# Patient Record
Sex: Female | Born: 1970 | Race: Black or African American | Hispanic: No | Marital: Married | Smoking: Never smoker
Health system: Southern US, Community
[De-identification: ages and names within clinical notes are randomized; demographics above are authoritative.]

## PROBLEM LIST (undated history)

## (undated) DIAGNOSIS — I10 Essential (primary) hypertension: Secondary | ICD-10-CM

## (undated) HISTORY — PX: OVARIAN CYST REMOVAL: SHX89

## (undated) HISTORY — PX: TUBAL LIGATION: SHX77

---

## 2013-06-13 ENCOUNTER — Ambulatory Visit: Payer: Self-pay | Admitting: Family Medicine

## 2013-06-13 ENCOUNTER — Encounter (HOSPITAL_COMMUNITY): Payer: Self-pay | Admitting: Emergency Medicine

## 2013-06-13 ENCOUNTER — Emergency Department (INDEPENDENT_AMBULATORY_CARE_PROVIDER_SITE_OTHER): Payer: Medicaid Other

## 2013-06-13 ENCOUNTER — Emergency Department (HOSPITAL_COMMUNITY)
Admission: EM | Admit: 2013-06-13 | Discharge: 2013-06-13 | Disposition: A | Discharge status: Home / Self Care | Payer: Medicaid Other | Source: Home / Self Care | Attending: Emergency Medicine | Admitting: Emergency Medicine

## 2013-06-13 DIAGNOSIS — J209 Acute bronchitis, unspecified: Secondary | ICD-10-CM

## 2013-06-13 MED ORDER — METHYLPREDNISOLONE SODIUM SUCC 125 MG IJ SOLR
INTRAMUSCULAR | Status: AC
  Filled 2013-06-13: qty 2

## 2013-06-13 MED ORDER — IPRATROPIUM BROMIDE 0.02 % IN SOLN
0.5000 mg | Freq: Once | RESPIRATORY_TRACT | Status: AC
  Administered 2013-06-13: 0.5 mg via RESPIRATORY_TRACT

## 2013-06-13 MED ORDER — HYDROCOD POLST-CHLORPHEN POLST 10-8 MG/5ML PO LQCR: 5.0000 mL | Freq: Two times a day (BID) | ORAL | Status: DC | PRN

## 2013-06-13 MED ORDER — IPRATROPIUM BROMIDE 0.02 % IN SOLN
RESPIRATORY_TRACT | Status: AC
  Filled 2013-06-13: qty 2.5

## 2013-06-13 MED ORDER — PREDNISONE 20 MG PO TABS: 20.0000 mg | ORAL_TABLET | Freq: Two times a day (BID) | ORAL | Status: DC

## 2013-06-13 MED ORDER — ALBUTEROL SULFATE HFA 108 (90 BASE) MCG/ACT IN AERS: 1.0000 | INHALATION_SPRAY | Freq: Four times a day (QID) | RESPIRATORY_TRACT | Status: AC | PRN

## 2013-06-13 MED ORDER — SODIUM CHLORIDE 0.9 % IJ SOLN
INTRAMUSCULAR | Status: AC
  Filled 2013-06-13: qty 3

## 2013-06-13 MED ORDER — ALBUTEROL SULFATE (5 MG/ML) 0.5% IN NEBU
5.0000 mg | INHALATION_SOLUTION | Freq: Once | RESPIRATORY_TRACT | Status: AC
  Administered 2013-06-13: 5 mg via RESPIRATORY_TRACT

## 2013-06-13 MED ORDER — AZITHROMYCIN 250 MG PO TABS: ORAL_TABLET | ORAL | Status: DC

## 2013-06-13 MED ORDER — METHYLPREDNISOLONE SODIUM SUCC 125 MG IJ SOLR
125.0000 mg | Freq: Once | INTRAMUSCULAR | Status: AC
  Administered 2013-06-13: 125 mg via INTRAMUSCULAR

## 2013-06-13 MED ORDER — ALBUTEROL SULFATE (5 MG/ML) 0.5% IN NEBU
INHALATION_SOLUTION | RESPIRATORY_TRACT | Status: AC
  Filled 2013-06-13: qty 1

## 2013-06-13 NOTE — ED Notes (Signed)
C/o cough and sob for two days  States chest feel tight Admits to having chills and sore throat

## 2013-06-13 NOTE — ED Provider Notes (Signed)
Chief Complaint:   Chief Complaint  Patient presents with  . Cough  . Shortness of Breath    History of Present Illness:   Lauren Galvan is a 42 year old female who has had a two-day history of cough productive yellow sputum, chest tightness, shortness of breath, aching in her ribs, chills, and sore throat. She denies any wheezing, fever, nasal congestion, rhinorrhea, or GI symptoms. She has no history of asthma. No known exposure to pertussis.  Review of Systems:  Other than noted above, the patient denies any of the following symptoms: Systemic:  No fevers, chills, sweats, weight loss or gain, fatigue, or tiredness. Eye:  No redness or discharge. ENT:  No ear pain, drainage, headache, nasal congestion, drainage, sinus pressure, difficulty swallowing, or sore throat. Neck:  No neck pain or swollen glands. Lungs:  No cough, sputum production, hemoptysis, wheezing, chest tightness, shortness of breath or chest pain. GI:  No abdominal pain, nausea, vomiting or diarrhea.  PMFSH:  Past medical history, family history, social history, meds, and allergies were reviewed. She is allergic to penicillin and latex. She takes hydrochlorothiazide for high blood pressure.  Physical Exam:   Vital signs:  BP 147/80  Pulse 94  Temp(Src) 98.5 F (36.9 C) (Oral)  Resp 16  SpO2 99%  LMP 05/29/2013 General:  Alert and oriented.  In no distress.  Skin warm and dry. Initially appeared somewhat tachypneic, but no audible wheezes, retractions, or use of accessory musculature. She has frequent paroxysms of coughing, but no inspiratory whoop. Eye:  No conjunctival injection or drainage. Lids were normal. ENT:  TMs and canals were normal, without erythema or inflammation.  Nasal mucosa was clear and uncongested, without drainage.  Mucous membranes were moist.  Pharynx was clear with no exudate or drainage.  There were no oral ulcerations or lesions. Neck:  Supple, no adenopathy, tenderness or mass. Lungs:  No  respiratory distress.  Lungs were clear to auscultation, without wheezes, rales or rhonchi.  Breath sounds were clear and equal bilaterally.  Heart:  Regular rhythm, without gallops, murmers or rubs. Skin:  Clear, warm, and dry, without rash or lesions.  Radiology:  Dg Chest 2 View  06/13/2013   CLINICAL DATA:  Cough, shortness of Breath  EXAM: CHEST  2 VIEW  COMPARISON:  None.  FINDINGS: The heart size and mediastinal contours are within normal limits. Both lungs are clear. The visualized skeletal structures are unremarkable.  IMPRESSION: No active cardiopulmonary disease.   Electronically Signed   By: Natasha Mead M.D.   On: 06/13/2013 13:30    Course in Urgent Care Center:   Her lungs were completely clear prior to breathing treatment. She was given Solu-Medrol 125 mg IM and a DuoNeb breathing treatment. After that she felt better, her lungs were still completely clear. She felt like she could go home.  Assessment:  The encounter diagnosis was Acute bronchitis.  Plan:   1.  Meds:  The following meds were prescribed:   Discharge Medication List as of 06/13/2013  1:57 PM    START taking these medications   Details  albuterol (PROVENTIL HFA;VENTOLIN HFA) 108 (90 BASE) MCG/ACT inhaler Inhale 1-2 puffs into the lungs every 6 (six) hours as needed for wheezing or shortness of breath., Starting 06/13/2013, Until Discontinued, Normal    azithromycin (ZITHROMAX Z-PAK) 250 MG tablet Take as directed., Normal    chlorpheniramine-HYDROcodone (TUSSIONEX) 10-8 MG/5ML LQCR Take 5 mLs by mouth every 12 (twelve) hours as needed for cough., Starting 06/13/2013, Until  Discontinued, Normal    predniSONE (DELTASONE) 20 MG tablet Take 1 tablet (20 mg total) by mouth 2 (two) times daily., Starting 06/13/2013, Until Discontinued, Normal        2.  Patient Education/Counseling:  The patient was given appropriate handouts, self care instructions, and instructed in symptomatic relief.   3.  Follow up:  The patient  was told to follow up if no better in 3 to 4 days, if becoming worse in any way, and given some red flag symptoms such as worsening difficulty breathing which would prompt immediate return.  Follow up here if necessary.      Reuben Likes, MD 06/13/13 [phone]

## 2013-06-17 NOTE — ED Notes (Signed)
Spouse has called , cannot afford non-insurance covered Rx, and is using delsym. New note to RTW on Monday

## 2013-07-23 ENCOUNTER — Other Ambulatory Visit (HOSPITAL_COMMUNITY)
Admission: RE | Admit: 2013-07-23 | Discharge: 2013-07-23 | Disposition: A | Discharge status: Home / Self Care | Payer: Medicaid Other | Source: Ambulatory Visit | Attending: Family Medicine | Admitting: Family Medicine

## 2013-07-23 ENCOUNTER — Encounter (HOSPITAL_COMMUNITY): Payer: Self-pay | Admitting: Emergency Medicine

## 2013-07-23 ENCOUNTER — Emergency Department (HOSPITAL_COMMUNITY)
Admission: EM | Admit: 2013-07-23 | Discharge: 2013-07-23 | Disposition: A | Discharge status: Home / Self Care | Payer: Medicaid Other | Source: Home / Self Care | Attending: Family Medicine | Admitting: Family Medicine

## 2013-07-23 DIAGNOSIS — Z113 Encounter for screening for infections with a predominantly sexual mode of transmission: Secondary | ICD-10-CM | POA: Insufficient documentation

## 2013-07-23 DIAGNOSIS — N760 Acute vaginitis: Secondary | ICD-10-CM | POA: Insufficient documentation

## 2013-07-23 HISTORY — DX: Essential (primary) hypertension: I10

## 2013-07-23 LAB — POCT URINALYSIS DIP (DEVICE)
Bilirubin Urine: NEGATIVE
Glucose, UA: NEGATIVE mg/dL
HGB URINE DIPSTICK: NEGATIVE
Ketones, ur: NEGATIVE mg/dL
Leukocytes, UA: NEGATIVE
Nitrite: NEGATIVE
Protein, ur: NEGATIVE mg/dL
Specific Gravity, Urine: 1.025 (ref 1.005–1.030)
Urobilinogen, UA: 0.2 mg/dL (ref 0.0–1.0)
pH: 6 (ref 5.0–8.0)

## 2013-07-23 LAB — POCT PREGNANCY, URINE: Preg Test, Ur: NEGATIVE

## 2013-07-23 MED ORDER — FLUCONAZOLE 150 MG PO TABS: 150.0000 mg | ORAL_TABLET | Freq: Once | ORAL | Status: DC

## 2013-07-23 MED ORDER — METRONIDAZOLE 500 MG PO TABS: 500.0000 mg | ORAL_TABLET | Freq: Two times a day (BID) | ORAL | Status: DC

## 2013-07-23 NOTE — Discharge Instructions (Signed)
Thank you for coming in today. Take metronidazole twice daily for one week. Do not drink alcoholic taking this medication. Take fluconazole once for yeast infection. We will call you if your results are very abnormal. Come back here or followup with your primary care provider if not getting better.  Bacterial Vaginosis Bacterial vaginosis is an infection of the vagina. It happens when too many of certain germs (bacteria) grow in the vagina. HOME CARE  Take your medicine as told by your doctor.  Finish your medicine even if you start to feel better.  Do not have sex until you finish your medicine and are better.  Tell your sex partner that you have an infection. They should see their doctor for treatment.  Practice safe sex. Use condoms. Have only one sex partner. GET HELP IF:  You are not getting better after 3 days of treatment.  You have more grey fluid (discharge) coming from your vagina than before.  You have more pain than before.  You have a fever. MAKE SURE YOU:   Understand these instructions.  Will watch your condition.  Will get help right away if you are not doing well or get worse. Document Released: 04/08/2008 Document Revised: 04/20/2013 Document Reviewed: 02/09/2013 [facility] Patient Information 2014 [employer], [place].

## 2013-07-23 NOTE — ED Notes (Signed)
Pt c/o white vag d/c onset 3 days w/sxs that include: vag itching, dysuria, abd pain Denies: hematuria, f/v/n/d She is alert w/no signs of acute distress.

## 2013-07-23 NOTE — ED Provider Notes (Signed)
Lauren Galvan is a 43 y.o. female who presents to Urgent Care today for to 3 days of vaginal discharge itching and burning sensation. Her symptoms are consistent with yeast infection. No fevers chills nausea vomiting or diarrhea. No urinary frequency urgency or dysuria. No abdominal pain. Patient feels well otherwise.   Past Medical History  Diagnosis Date  . Hypertension    History  Substance Use Topics  . Smoking status: Never Smoker   . Smokeless tobacco: Not on file  . Alcohol Use: No   ROS as above Medications reviewed. No current facility-administered medications for this encounter.   Current Outpatient Prescriptions  Medication Sig Dispense Refill  . albuterol (PROVENTIL HFA;VENTOLIN HFA) 108 (90 BASE) MCG/ACT inhaler Inhale 1-2 puffs into the lungs every 6 (six) hours as needed for wheezing or shortness of breath.  1 Inhaler  0  . fluconazole (DIFLUCAN) 150 MG tablet Take 1 tablet (150 mg total) by mouth once.  1 tablet  1  . metroNIDAZOLE (FLAGYL) 500 MG tablet Take 1 tablet (500 mg total) by mouth 2 (two) times daily.  14 tablet  0    Exam:  BP 120/83  Pulse 98  Temp(Src) 97.9 F (36.6 C) (Oral)  Resp 18  SpO2 98%  LMP 07/18/2013 Gen: Well NAD Abd: NABS, Soft. NT, ND Exts: , warm and well perfused.  GYN: Normal external genitalia. Vaginal canal with thick white discharge. Normal-appearing cervix. Nontender.  Results for orders placed during the hospital encounter of 07/23/13 (from the past 24 hour(s))  POCT URINALYSIS DIP (DEVICE)     Status: None   Collection Time    07/23/13 11:33 AM      Result Value Range   Glucose, UA NEGATIVE  NEGATIVE mg/dL   Bilirubin Urine NEGATIVE  NEGATIVE   Ketones, ur NEGATIVE  NEGATIVE mg/dL   Specific Gravity, Urine 1.025  1.005 - 1.030   Hgb urine dipstick NEGATIVE  NEGATIVE   pH 6.0  5.0 - 8.0   Protein, ur NEGATIVE  NEGATIVE mg/dL   Urobilinogen, UA 0.2  0.0 - 1.0 mg/dL   Nitrite NEGATIVE  NEGATIVE   Leukocytes, UA  NEGATIVE  NEGATIVE  POCT PREGNANCY, URINE     Status: None   Collection Time    07/23/13 11:50 AM      Result Value Range   Preg Test, Ur NEGATIVE  NEGATIVE   No results found.  Assessment and Plan: 43 y.o. female with vaginitis. Plan to treat empirically for both yeast and BV with metronidazole and fluconazole. Cytology pending. Will call patient with results of significantly abnormal.  Discussed warning signs or symptoms. Please see discharge instructions. Patient expresses understanding.    Rodolph BongEvan S Corey, MD 07/23/13 [phone]

## 2013-07-27 NOTE — ED Notes (Signed)
GC/Chlamydia neg., Affirm: Candida pos., Gardnerella and Trich neg.  Pt. adequately treated with Diflucan. Vassie MoselleYork, Satomi Buda M 07/27/2013

## 2013-08-17 ENCOUNTER — Encounter (HOSPITAL_COMMUNITY): Payer: Self-pay | Admitting: Emergency Medicine

## 2013-08-17 ENCOUNTER — Emergency Department (HOSPITAL_COMMUNITY)
Admission: EM | Admit: 2013-08-17 | Discharge: 2013-08-17 | Disposition: A | Discharge status: Home / Self Care | Payer: Medicaid Other | Source: Home / Self Care | Attending: Family Medicine | Admitting: Family Medicine

## 2013-08-17 DIAGNOSIS — I10 Essential (primary) hypertension: Secondary | ICD-10-CM

## 2013-08-17 LAB — POCT I-STAT, CHEM 8
BUN: 10 mg/dL (ref 6–23)
CREATININE: 1.3 mg/dL — AB (ref 0.50–1.10)
Calcium, Ion: 1.26 mmol/L — ABNORMAL HIGH (ref 1.12–1.23)
Chloride: 101 mEq/L (ref 96–112)
GLUCOSE: 85 mg/dL (ref 70–99)
HCT: 44 % (ref 36.0–46.0)
HEMOGLOBIN: 15 g/dL (ref 12.0–15.0)
Potassium: 3.7 mEq/L (ref 3.7–5.3)
SODIUM: 138 meq/L (ref 137–147)
TCO2: 28 mmol/L (ref 0–100)

## 2013-08-17 MED ORDER — AMLODIPINE BESYLATE 5 MG PO TABS
5.0000 mg | ORAL_TABLET | Freq: Every day | ORAL | Status: DC
Start: 2013-08-17 — End: 2014-06-15

## 2013-08-17 NOTE — ED Notes (Signed)
Pt needing refills on her HCTZ that she's been out x1 year C/o constant HA... Hs appt w/new PCP in 2 weeks Denies: CP, nauseas, BV, SOB Alert w/no signs of acute distress.

## 2013-08-17 NOTE — ED Provider Notes (Signed)
CSN: [id number]     Arrival date & time 08/17/13  1815 History   First MD Initiated Contact with Patient 08/17/13 1823     Chief Complaint  Patient presents with  . Hypertension   (Consider location/radiation/quality/duration/timing/severity/associated sxs/prior Treatment) Patient is a 43 y.o. female presenting with hypertension. The history is provided by the patient.  Hypertension This is a chronic problem. The current episode started more than 1 week ago (out of bp med for 1 yr since moved to [facility] The problem occurs constantly. Associated symptoms include headaches. Pertinent negatives include no chest pain, no abdominal pain and no shortness of breath.    Past Medical History  Diagnosis Date  . Hypertension    History reviewed. No pertinent past surgical history. No family history on file. History  Substance Use Topics  . Smoking status: Never Smoker   . Smokeless tobacco: Not on file  . Alcohol Use: No   OB History   Grav Para Term Preterm Abortions TAB SAB Ect Mult Living                 Review of Systems  Constitutional: Positive for fatigue.  Respiratory: Negative for chest tightness and shortness of breath.   Cardiovascular: Negative for chest pain, palpitations and leg swelling.  Gastrointestinal: Negative.  Negative for abdominal pain.  Neurological: Positive for headaches.    Allergies  Latex and Penicillins  Home Medications   Current Outpatient Rx  Name  Route  Sig  Dispense  Refill  . albuterol (PROVENTIL HFA;VENTOLIN HFA) 108 (90 BASE) MCG/ACT inhaler   Inhalation   Inhale 1-2 puffs into the lungs every 6 (six) hours as needed for wheezing or shortness of breath.   1 Inhaler   0   . amLODipine (NORVASC) 5 MG tablet   Oral   Take 1 tablet (5 mg total) by mouth daily.   30 tablet   1   . fluconazole (DIFLUCAN) 150 MG tablet   Oral   Take 1 tablet (150 mg total) by mouth once.   1 tablet   1   . metroNIDAZOLE (FLAGYL) 500 MG tablet    Oral   Take 1 tablet (500 mg total) by mouth 2 (two) times daily.   14 tablet   0    BP 147/93  Pulse 84  Temp(Src) 97.8 F (36.6 C) (Oral)  Resp 18  SpO2 97%  LMP 08/12/2013 Physical Exam  Nursing note and vitals reviewed. Constitutional: She is oriented to person, place, and time. She appears well-developed and well-nourished.  HENT:  Head: Normocephalic.  Right Ear: External ear normal.  Left Ear: External ear normal.  Mouth/Throat: Oropharynx is clear and moist.  Eyes: Conjunctivae are normal. Pupils are equal, round, and reactive to light.  Neck: Normal range of motion. Neck supple. No thyromegaly present.  Cardiovascular: Normal rate, regular rhythm, normal heart sounds and intact distal pulses.   Pulmonary/Chest: Effort normal and breath sounds normal.  Musculoskeletal: She exhibits no edema.  Neurological: She is alert and oriented to person, place, and time.  Skin: Skin is warm and dry.    ED Course  Procedures (including critical care time) Labs Review Labs Reviewed  POCT I-STAT, CHEM 8 - Abnormal; Notable for the following:    Creatinine, Ser 1.30 (*)    Calcium, Ion 1.26 (*)    All other components within normal limits   Imaging Review No results found.    MDM      Linna Hoff,  MD 08/17/13 Ernestina [id number]

## 2013-09-23 ENCOUNTER — Emergency Department (HOSPITAL_COMMUNITY): Payer: Medicaid Other

## 2013-09-23 ENCOUNTER — Encounter (HOSPITAL_COMMUNITY): Payer: Self-pay | Admitting: Emergency Medicine

## 2013-09-23 ENCOUNTER — Emergency Department (INDEPENDENT_AMBULATORY_CARE_PROVIDER_SITE_OTHER): Payer: Medicaid Other

## 2013-09-23 ENCOUNTER — Emergency Department (HOSPITAL_COMMUNITY)
Admission: EM | Admit: 2013-09-23 | Discharge: 2013-09-23 | Disposition: A | Discharge status: Nursing Home (Includes ICF) | Payer: Medicaid Other | Source: Home / Self Care | Attending: Family Medicine | Admitting: Family Medicine

## 2013-09-23 ENCOUNTER — Emergency Department (HOSPITAL_COMMUNITY)
Admission: EM | Admit: 2013-09-23 | Discharge: 2013-09-23 | Disposition: A | Discharge status: Home / Self Care | Payer: Medicaid Other | Attending: Emergency Medicine | Admitting: Emergency Medicine

## 2013-09-23 DIAGNOSIS — Z91040 Latex allergy status: Secondary | ICD-10-CM | POA: Insufficient documentation

## 2013-09-23 DIAGNOSIS — R109 Unspecified abdominal pain: Secondary | ICD-10-CM

## 2013-09-23 DIAGNOSIS — N926 Irregular menstruation, unspecified: Secondary | ICD-10-CM | POA: Insufficient documentation

## 2013-09-23 DIAGNOSIS — E669 Obesity, unspecified: Secondary | ICD-10-CM | POA: Insufficient documentation

## 2013-09-23 DIAGNOSIS — I10 Essential (primary) hypertension: Secondary | ICD-10-CM | POA: Insufficient documentation

## 2013-09-23 DIAGNOSIS — Z79899 Other long term (current) drug therapy: Secondary | ICD-10-CM | POA: Insufficient documentation

## 2013-09-23 DIAGNOSIS — Z880 Allergy status to penicillin: Secondary | ICD-10-CM | POA: Insufficient documentation

## 2013-09-23 DIAGNOSIS — R300 Dysuria: Secondary | ICD-10-CM | POA: Insufficient documentation

## 2013-09-23 LAB — CBC WITH DIFFERENTIAL/PLATELET
BASOS PCT: 1 % (ref 0–1)
Basophils Absolute: 0.1 10*3/uL (ref 0.0–0.1)
EOS ABS: 0.6 10*3/uL (ref 0.0–0.7)
Eosinophils Relative: 6 % — ABNORMAL HIGH (ref 0–5)
HCT: 40.9 % (ref 36.0–46.0)
Hemoglobin: 14.1 g/dL (ref 12.0–15.0)
Lymphocytes Relative: 24 % (ref 12–46)
Lymphs Abs: 2.2 10*3/uL (ref 0.7–4.0)
MCH: 30 pg (ref 26.0–34.0)
MCHC: 34.5 g/dL (ref 30.0–36.0)
MCV: 87 fL (ref 78.0–100.0)
Monocytes Absolute: 1 10*3/uL (ref 0.1–1.0)
Monocytes Relative: 11 % (ref 3–12)
NEUTROS PCT: 58 % (ref 43–77)
Neutro Abs: 5.1 10*3/uL (ref 1.7–7.7)
PLATELETS: 292 10*3/uL (ref 150–400)
RBC: 4.7 MIL/uL (ref 3.87–5.11)
RDW: 13.2 % (ref 11.5–15.5)
WBC: 8.9 10*3/uL (ref 4.0–10.5)

## 2013-09-23 LAB — COMPREHENSIVE METABOLIC PANEL
ALBUMIN: 3.6 g/dL (ref 3.5–5.2)
ALT: 12 U/L (ref 0–35)
AST: 17 U/L (ref 0–37)
Alkaline Phosphatase: 65 U/L (ref 39–117)
BUN: 8 mg/dL (ref 6–23)
CO2: 26 mEq/L (ref 19–32)
Calcium: 9.3 mg/dL (ref 8.4–10.5)
Chloride: 100 mEq/L (ref 96–112)
Creatinine, Ser: 0.78 mg/dL (ref 0.50–1.10)
GFR calc Af Amer: >90 mL/min (ref >90)
GFR calc non Af Amer: >90 mL/min (ref >90)
Glucose, Bld: 93 mg/dL (ref 70–99)
Potassium: 4 mEq/L (ref 3.7–5.3)
SODIUM: 138 meq/L (ref 137–147)
TOTAL PROTEIN: 8.2 g/dL (ref 6.0–8.3)
Total Bilirubin: 0.3 mg/dL (ref 0.3–1.2)

## 2013-09-23 LAB — POCT URINALYSIS DIP (DEVICE)
BILIRUBIN URINE: NEGATIVE
Glucose, UA: NEGATIVE mg/dL
Ketones, ur: NEGATIVE mg/dL
LEUKOCYTES UA: NEGATIVE
NITRITE: NEGATIVE
PROTEIN: NEGATIVE mg/dL
Specific Gravity, Urine: 1.02 (ref 1.005–1.030)
Urobilinogen, UA: 0.2 mg/dL (ref 0.0–1.0)
pH: 6 (ref 5.0–8.0)

## 2013-09-23 LAB — URINALYSIS, ROUTINE W REFLEX MICROSCOPIC
Bilirubin Urine: NEGATIVE
GLUCOSE, UA: NEGATIVE mg/dL
Hgb urine dipstick: NEGATIVE
Ketones, ur: NEGATIVE mg/dL
LEUKOCYTES UA: NEGATIVE
Nitrite: NEGATIVE
PH: 7 (ref 5.0–8.0)
Protein, ur: NEGATIVE mg/dL
Specific Gravity, Urine: 1.02 (ref 1.005–1.030)
Urobilinogen, UA: 0.2 mg/dL (ref 0.0–1.0)

## 2013-09-23 LAB — POCT PREGNANCY, URINE: Preg Test, Ur: NEGATIVE

## 2013-09-23 MED ORDER — IBUPROFEN 800 MG PO TABS
800.0000 mg | ORAL_TABLET | Freq: Once | ORAL | Status: AC
  Administered 2013-09-23: 800 mg via ORAL
  Filled 2013-09-23: qty 1

## 2013-09-23 MED ORDER — HYDROCODONE-ACETAMINOPHEN 5-325 MG PO TABS
2.0000 | ORAL_TABLET | Freq: Once | ORAL | Status: AC
  Administered 2013-09-23: 2 via ORAL
  Filled 2013-09-23: qty 2

## 2013-09-23 MED ORDER — HYDROCODONE-ACETAMINOPHEN 5-325 MG PO TABS: 1.0000 | ORAL_TABLET | Freq: Four times a day (QID) | ORAL | Status: DC | PRN

## 2013-09-23 NOTE — ED Notes (Signed)
Pt states she had blood in her urine. Pt has UC paperwork

## 2013-09-23 NOTE — ED Provider Notes (Signed)
CSN: [id number]     Arrival date & time 09/23/13  1040 History   First MD Initiated Contact with Patient 09/23/13 1145     Chief Complaint  Patient presents with  . Back Pain   (Consider location/radiation/quality/duration/timing/severity/associated sxs/prior Treatment) Patient is a 43 y.o. female presenting with back pain. The history is provided by the patient.  Back Pain Location:  Lumbar spine Quality:  Cramping and stabbing Radiates to:  Does not radiate Pain severity:  Moderate Onset quality:  Sudden Duration:  1 week Progression:  Worsening Chronicity:  New Context comment:  Gradual until worse last eve, no other sx. Associated symptoms: abdominal pain   Associated symptoms: no abdominal swelling, no bladder incontinence, no bowel incontinence, no chest pain, no dysuria, no fever and no pelvic pain     Past Medical History  Diagnosis Date  . Hypertension    History reviewed. No pertinent past surgical history. History reviewed. No pertinent family history. History  Substance Use Topics  . Smoking status: Never Smoker   . Smokeless tobacco: Not on file  . Alcohol Use: No   OB History   Grav Para Term Preterm Abortions TAB SAB Ect Mult Living                 Review of Systems  Constitutional: Negative for fever.  Cardiovascular: Negative for chest pain.  Gastrointestinal: Positive for abdominal pain. Negative for nausea, vomiting, diarrhea, constipation and bowel incontinence.  Genitourinary: Negative for bladder incontinence, dysuria and pelvic pain.  Musculoskeletal: Positive for back pain. Negative for gait problem.    Allergies  Latex and Penicillins  Home Medications   Current Outpatient Rx  Name  Route  Sig  Dispense  Refill  . albuterol (PROVENTIL HFA;VENTOLIN HFA) 108 (90 BASE) MCG/ACT inhaler   Inhalation   Inhale 1-2 puffs into the lungs every 6 (six) hours as needed for wheezing or shortness of breath.   1 Inhaler   0   . amLODipine  (NORVASC) 5 MG tablet   Oral   Take 1 tablet (5 mg total) by mouth daily.   30 tablet   1   . fluconazole (DIFLUCAN) 150 MG tablet   Oral   Take 1 tablet (150 mg total) by mouth once.   1 tablet   1   . metroNIDAZOLE (FLAGYL) 500 MG tablet   Oral   Take 1 tablet (500 mg total) by mouth 2 (two) times daily.   14 tablet   0    BP 159/101  Pulse 71  Temp(Src) 98.1 F (36.7 C) (Oral)  SpO2 100%  LMP 09/10/2013 Physical Exam  Nursing note and vitals reviewed. Constitutional: She is oriented to person, place, and time. She appears well-developed and well-nourished. No distress.  HENT:  Head: Normocephalic.  Mouth/Throat: Oropharynx is clear and moist.  Neck: Normal range of motion. Neck supple.  Cardiovascular: Normal heart sounds.   Pulmonary/Chest: Effort normal and breath sounds normal.  Abdominal: Soft. Bowel sounds are normal. She exhibits no distension and no mass. There is no hepatosplenomegaly. There is tenderness. There is CVA tenderness. There is no rigidity, no rebound and no guarding.  Lymphadenopathy:    She has no cervical adenopathy.  Neurological: She is alert and oriented to person, place, and time.  Skin: Skin is warm and dry.    ED Course  Procedures (including critical care time) Labs Review Labs Reviewed  POCT URINALYSIS DIP (DEVICE) - Abnormal; Notable for the following:    Hgb  urine dipstick TRACE (*)    All other components within normal limits  POCT PREGNANCY, URINE   Imaging Review Dg Abd 2 Views  09/23/2013   CLINICAL DATA:  Back pain  EXAM: ABDOMEN - 2 VIEW  COMPARISON:  None.  FINDINGS: Scattered large and small bowel gas is identified. Fecal material is noted throughout the colon. No acute bony abnormality is seen.  IMPRESSION: No acute abnormality noted.   Electronically Signed   By: Alcide CleverMark  Lukens M.D.   On: 09/23/2013 12:34     MDM   1. Right flank pain    Sent for ct abd r/o right ureteral stone.    Linna HoffJames D Ishaan Villamar, MD 09/23/13  [phone]

## 2013-09-23 NOTE — ED Provider Notes (Signed)
CSN: [id number]     Arrival date & time 09/23/13  1327 History   First MD Initiated Contact with Patient 09/23/13 1657     Chief Complaint  Patient presents with  . Flank Pain     (Consider location/radiation/quality/duration/timing/severity/associated sxs/prior Treatment) HPI Patient transferred to [facility]. Patient complains of right flank pain, nonradiating onset 4 days ago. Pain is worse with changing positions not improved by anything. No urinary symptoms. No fever. No other associated symptoms. Last menstrual period 3 weeks ago. She had 2 menstrual periods in February, 2015 which is slightly abnormal for her. Pain does not feel like pain from ovarian cyst she's had in the past. She denies abdominal pain. She's been treated with Aleve, without relief. Pain is mild presently. Past Medical History  Diagnosis Date  . Hypertension    History reviewed. No pertinent past surgical history. History reviewed. No pertinent family history. History  Substance Use Topics  . Smoking status: Never Smoker   . Smokeless tobacco: Not on file  . Alcohol Use: No   OB History   Grav Para Term Preterm Abortions TAB SAB Ect Mult Living                 Review of Systems  Genitourinary: Positive for dysuria and flank pain.       Irregular menses      Allergies  Latex and Penicillins  Home Medications   Current Outpatient Rx  Name  Route  Sig  Dispense  Refill  . albuterol (PROVENTIL HFA;VENTOLIN HFA) 108 (90 BASE) MCG/ACT inhaler   Inhalation   Inhale 1-2 puffs into the lungs every 6 (six) hours as needed for wheezing or shortness of breath.   1 Inhaler   0   . amLODipine (NORVASC) 5 MG tablet   Oral   Take 1 tablet (5 mg total) by mouth daily.   30 tablet   1    BP 146/82  Pulse 89  Temp(Src) 98.3 F (36.8 C) (Oral)  Resp 20  Ht 4\' 11"  (1.499 m)  Wt 172 lb (78.019 kg)  BMI 34.72 kg/m2  SpO2 100%  LMP 09/10/2013 Physical Exam  Nursing note and  vitals reviewed. Constitutional: She appears well-developed and well-nourished.  HENT:  Head: Normocephalic and atraumatic.  Eyes: Conjunctivae are normal. Pupils are equal, round, and reactive to light.  Neck: Neck supple. No tracheal deviation present. No thyromegaly present.  Cardiovascular: Normal rate and regular rhythm.   No murmur heard. Pulmonary/Chest: Effort normal and breath sounds normal.  Abdominal: Soft. Bowel sounds are normal. She exhibits no distension. There is no tenderness.  Obese  Genitourinary:  Right flank tenderness. Pain is exacerbated by sitting up from a supine position.  Musculoskeletal: Normal range of motion. She exhibits no edema and no tenderness.  Neurological: She is alert. Coordination normal.  Skin: Skin is warm and dry. No rash noted.  Psychiatric: She has a normal mood and affect.    ED Course  Procedures (including critical care time) Labs Review Labs Reviewed  CBC WITH DIFFERENTIAL - Abnormal; Notable for the following:    Eosinophils Relative 6 (*)    All other components within normal limits  COMPREHENSIVE METABOLIC PANEL  URINALYSIS, ROUTINE W REFLEX MICROSCOPIC   Imaging Review Dg Abd 2 Views  09/23/2013   CLINICAL DATA:  Back pain  EXAM: ABDOMEN - 2 VIEW  COMPARISON:  None.  FINDINGS: Scattered large and small bowel gas is identified. Fecal material is  noted throughout the colon. No acute bony abnormality is seen.  IMPRESSION: No acute abnormality noted.   Electronically Signed   By: Alcide Clever M.D.   On: 09/23/2013 12:34     EKG Interpretation None       Results for orders placed during the hospital encounter of 09/23/13  CBC WITH DIFFERENTIAL      Result Value Ref Range   WBC 8.9  4.0 - 10.5 K/uL   RBC 4.70  3.87 - 5.11 MIL/uL   Hemoglobin 14.1  12.0 - 15.0 g/dL   HCT 16.1  09.6 - 04.5 %   MCV 87.0  78.0 - 100.0 fL   MCH 30.0  26.0 - 34.0 pg   MCHC 34.5  30.0 - 36.0 g/dL   RDW 40.9  81.1 - 91.4 %   Platelets 292  150 -  400 K/uL   Neutrophils Relative % 58  43 - 77 %   Neutro Abs 5.1  1.7 - 7.7 K/uL   Lymphocytes Relative 24  12 - 46 %   Lymphs Abs 2.2  0.7 - 4.0 K/uL   Monocytes Relative 11  3 - 12 %   Monocytes Absolute 1.0  0.1 - 1.0 K/uL   Eosinophils Relative 6 (*) 0 - 5 %   Eosinophils Absolute 0.6  0.0 - 0.7 K/uL   Basophils Relative 1  0 - 1 %   Basophils Absolute 0.1  0.0 - 0.1 K/uL  COMPREHENSIVE METABOLIC PANEL      Result Value Ref Range   Sodium 138  137 - 147 mEq/L   Potassium 4.0  3.7 - 5.3 mEq/L   Chloride 100  96 - 112 mEq/L   CO2 26  19 - 32 mEq/L   Glucose, Bld 93  70 - 99 mg/dL   BUN 8  6 - 23 mg/dL   Creatinine, Ser 7.82  0.50 - 1.10 mg/dL   Calcium 9.3  8.4 - 95.6 mg/dL   Total Protein 8.2  6.0 - 8.3 g/dL   Albumin 3.6  3.5 - 5.2 g/dL   AST 17  0 - 37 U/L   ALT 12  0 - 35 U/L   Alkaline Phosphatase 65  39 - 117 U/L   Total Bilirubin 0.3  0.3 - 1.2 mg/dL   GFR calc non Af Amer >90  >90 mL/min   GFR calc Af Amer >90  >90 mL/min  URINALYSIS, ROUTINE W REFLEX MICROSCOPIC      Result Value Ref Range   Color, Urine YELLOW  YELLOW   APPearance CLEAR  CLEAR   Specific Gravity, Urine 1.020  1.005 - 1.030   pH 7.0  5.0 - 8.0   Glucose, UA NEGATIVE  NEGATIVE mg/dL   Hgb urine dipstick NEGATIVE  NEGATIVE   Bilirubin Urine NEGATIVE  NEGATIVE   Ketones, ur NEGATIVE  NEGATIVE mg/dL   Protein, ur NEGATIVE  NEGATIVE mg/dL   Urobilinogen, UA 0.2  0.0 - 1.0 mg/dL   Nitrite NEGATIVE  NEGATIVE   Leukocytes, UA NEGATIVE  NEGATIVE   Ct Abdomen Pelvis Wo Contrast  09/23/2013   CLINICAL DATA:  Right flank pain and dysuria  EXAM: CT ABDOMEN AND PELVIS WITHOUT CONTRAST  TECHNIQUE: Multidetector CT imaging of the abdomen and pelvis was performed following the standard protocol without intravenous contrast.  COMPARISON:  None.  FINDINGS: The lung bases are clear except for minimal dependent subpleural atelectasis. The heart is normal in size. No pericardial effusion.  The unenhanced appearance  of the liver is unremarkable. No focal lesions or biliary dilatation. The gallbladder is normal. No common bowel duct dilatation. The pancreas is unremarkable. The spleen is normal in size. No focal lesions. The adrenal glands and kidneys are unremarkable. No renal or obstructing ureteral calculi or bladder calculi.  The stomach is distended with food and fluid. The duodenum, small bowel and colon are unremarkable without oral contrast. No inflammatory changes, mass lesions or obstructive findings. No mesenteric or retroperitoneal mass or adenopathy. Small scattered lymph nodes are noted. The aorta is normal in caliber. No atherosclerotic calcifications. A circumaortic left renal vein is noted.  There is an enlarged fibroid uterus. The uterus measures 15 x 9.5 x 8.8 cm. Both ovaries are grossly normal. No pelvic adenopathy or free pelvic fluid collections. Small scattered lymph nodes are noted. No inguinal mass or adenopathy.  IMPRESSION: No acute abdominal/ pelvic findings. No renal or obstructing ureteral calculi.  Enlarged fibroid uterus.   Electronically Signed   By: Loralie ChampagneMark  Gallerani M.D.   On: 09/23/2013 17:43   Dg Abd 2 Views  09/23/2013   CLINICAL DATA:  Back pain  EXAM: ABDOMEN - 2 VIEW  COMPARISON:  None.  FINDINGS: Scattered large and small bowel gas is identified. Fecal material is noted throughout the colon. No acute bony abnormality is seen.  IMPRESSION: No acute abnormality noted.   Electronically Signed   By: Alcide CleverMark  Lukens M.D.   On: 09/23/2013 12:34    Did not receive adequate pain relief after treatment with ibuprofen. Norco ordered prior to discharge. At 7:15 PM she feels her to home.  MDM   Final diagnoses:  None   Pain felt to be muscular in etiology. Plan prescription Norco. Followup with primary care physician if significant pain by next week. Diagnosis right flank pain    Doug SouSam Chanika Byland, MD 09/23/13 [phone]

## 2013-09-23 NOTE — ED Notes (Signed)
Pt  Reports  Pain r  Flank /  Side  And  Back  X  1  Week      - pt  denys  Any  inj  denys  Any     Vomiting

## 2013-09-23 NOTE — ED Notes (Signed)
Pt sent here from urgent care for right flank pain, sts no hx of kidney problems, does report fibroids but they typically affect left side.

## 2013-09-23 NOTE — Discharge Instructions (Signed)
Take Tylenol for mild pain or the pain medicine prescribed for bad pain. See your primary care physician if having significant pain by next week. Return if concerned for any reason

## 2013-10-26 ENCOUNTER — Other Ambulatory Visit: Payer: Self-pay

## 2013-11-15 ENCOUNTER — Other Ambulatory Visit: Payer: Self-pay

## 2013-12-23 ENCOUNTER — Emergency Department (HOSPITAL_COMMUNITY)
Admission: EM | Admit: 2013-12-23 | Discharge: 2013-12-23 | Disposition: A | Discharge status: Home / Self Care | Payer: Medicaid Other | Source: Home / Self Care | Attending: Emergency Medicine | Admitting: Emergency Medicine

## 2013-12-23 ENCOUNTER — Encounter (HOSPITAL_COMMUNITY): Payer: Self-pay | Admitting: Emergency Medicine

## 2013-12-23 DIAGNOSIS — R519 Headache, unspecified: Secondary | ICD-10-CM

## 2013-12-23 DIAGNOSIS — R5383 Other fatigue: Secondary | ICD-10-CM

## 2013-12-23 DIAGNOSIS — R51 Headache: Secondary | ICD-10-CM

## 2013-12-23 DIAGNOSIS — R5381 Other malaise: Secondary | ICD-10-CM

## 2013-12-23 DIAGNOSIS — R531 Weakness: Secondary | ICD-10-CM

## 2013-12-23 LAB — CBC WITH DIFFERENTIAL/PLATELET
Basophils Absolute: 0.1 10*3/uL (ref 0.0–0.1)
Basophils Relative: 1 % (ref 0–1)
EOS PCT: 5 % (ref 0–5)
Eosinophils Absolute: 0.5 10*3/uL (ref 0.0–0.7)
HEMATOCRIT: 42.7 % (ref 36.0–46.0)
Hemoglobin: 14.3 g/dL (ref 12.0–15.0)
LYMPHS ABS: 2.5 10*3/uL (ref 0.7–4.0)
LYMPHS PCT: 26 % (ref 12–46)
MCH: 29.4 pg (ref 26.0–34.0)
MCHC: 33.5 g/dL (ref 30.0–36.0)
MCV: 87.7 fL (ref 78.0–100.0)
MONO ABS: 0.9 10*3/uL (ref 0.1–1.0)
Monocytes Relative: 9 % (ref 3–12)
NEUTROS ABS: 5.4 10*3/uL (ref 1.7–7.7)
Neutrophils Relative %: 59 % (ref 43–77)
PLATELETS: 347 10*3/uL (ref 150–400)
RBC: 4.87 MIL/uL (ref 3.87–5.11)
RDW: 13.6 % (ref 11.5–15.5)
WBC: 9.3 10*3/uL (ref 4.0–10.5)

## 2013-12-23 LAB — POCT I-STAT, CHEM 8
BUN: 8 mg/dL (ref 6–23)
CREATININE: 1 mg/dL (ref 0.50–1.10)
Calcium, Ion: 1.19 mmol/L (ref 1.12–1.23)
Chloride: 96 mEq/L (ref 96–112)
Glucose, Bld: 116 mg/dL — ABNORMAL HIGH (ref 70–99)
HCT: 50 % — ABNORMAL HIGH (ref 36.0–46.0)
Hemoglobin: 17 g/dL — ABNORMAL HIGH (ref 12.0–15.0)
Potassium: 4.4 mEq/L (ref 3.7–5.3)
Sodium: 138 mEq/L (ref 137–147)
TCO2: 28 mmol/L (ref 0–100)

## 2013-12-23 LAB — POCT PREGNANCY, URINE: Preg Test, Ur: NEGATIVE

## 2013-12-23 LAB — POCT URINALYSIS DIP (DEVICE)
Bilirubin Urine: NEGATIVE
GLUCOSE, UA: NEGATIVE mg/dL
Ketones, ur: NEGATIVE mg/dL
Leukocytes, UA: NEGATIVE
Nitrite: NEGATIVE
Protein, ur: NEGATIVE mg/dL
SPECIFIC GRAVITY, URINE: 1.02 (ref 1.005–1.030)
UROBILINOGEN UA: 0.2 mg/dL (ref 0.0–1.0)
pH: 5.5 (ref 5.0–8.0)

## 2013-12-23 MED ORDER — METOCLOPRAMIDE HCL 5 MG/ML IJ SOLN
INTRAMUSCULAR | Status: AC
  Filled 2013-12-23: qty 2

## 2013-12-23 MED ORDER — ACETAMINOPHEN 325 MG PO TABS
ORAL_TABLET | ORAL | Status: AC
  Filled 2013-12-23: qty 2

## 2013-12-23 MED ORDER — ACETAMINOPHEN 325 MG PO TABS
650.0000 mg | ORAL_TABLET | Freq: Once | ORAL | Status: AC
  Administered 2013-12-23: 650 mg via ORAL

## 2013-12-23 MED ORDER — METOCLOPRAMIDE HCL 10 MG PO TABS: 10.0000 mg | ORAL_TABLET | Freq: Once | ORAL | Status: DC

## 2013-12-23 MED ORDER — DICLOFENAC SODIUM 75 MG PO TBEC: DELAYED_RELEASE_TABLET | ORAL | Status: AC

## 2013-12-23 MED ORDER — AMLODIPINE BESYLATE 10 MG PO TABS: 10.0000 mg | ORAL_TABLET | Freq: Every day | ORAL | Status: DC

## 2013-12-23 MED ORDER — METOCLOPRAMIDE HCL 10 MG PO TABS: ORAL_TABLET | ORAL | Status: DC

## 2013-12-23 NOTE — Discharge Instructions (Signed)
Heat Disorders  Heat related disorders are illnesses caused by continued exposure to hot and humid environments, not drinking enough fluids, and/or your body failing to regulate its temperature correctly. People suffer from heat stress and heat related disorders when their bodies are unable to compensate and cool down through sweating. With sufficient heat, sweating is not enough to keep you cool, and your body temperature can rise quickly. Very high body temperatures can damage your brain and other vital organs. High humidity (moisture in the air), adds to heat stress, because it is harder for sweat to evaporate and cool your body. Heat stress and disorders are not uncommon. Some medicines can increase your risk for heat related illness. Ask your caregiver about your medicines during periods of intense heat.   Heat related disorders include:   Heatstroke. When you cannot sweat or regulate your body temperature in an adequate way. This is very dangerous and can be life threatening. Get emergency medical help.   Heat exhaustion. Overheating causes heavy sweating and a fast heart rate. Your body can still regulate its own temperature.   Heat cramps. Painful, uncontrollable muscle spasms. Can occur during heavy exercise in hot environments.   Sunburn. Skin becomes red and painful (burned) after being out in the sun.   Heat rash. Sweat ducts become blocked, which traps sweat under the skin. This causes blisters and red bumps and may cause an itchy or tingling feeling.  PREVENTING HEAT STRESS AND HEAT RELATED DISORDERS  Overheating can be dangerous and life threatening. When exercising, working, or doing other activities in hot and humid environments, do the following:   Stay informed by listening to and watching local broadcast weather and safety updates during intense heat.   Air conditioning is the best way to prevent heat disorders. If your home is not air conditioned, spend time in air conditioned places  (malls, public libraries, or heat shelters set up by your local health department).   Wear light-weight, light colored, loose fitting clothing. Wear as little clothing as possible when at home.   Increase your fluid intake. Drink enough water and fluids to keep your urine clear or pale yellow. DO NOT WAIT UNTIL YOU ARE THIRSTY TO DRINK. You may already be heat stressed, and not recognize it.   If your caregiver has suggested that you limit the amount of fluid you drink or has prescribed water pills for a medical problem, ask how much you should drink when the weather is hot.   Do not drink liquids with alcohol, caffeine, or lots of sugar. They can cause more loss of body fluid.   Heavy sweating drains your body's salt and minerals, which must be replaced. If you must exercise in the heat, a sports beverage can replace the salt and minerals you lose in sweat. If you are on a low-salt diet, check with your caregiver before drinking a sports beverage.   Sunburn reduces your body's ability to cool itself and causes a loss of needed body fluids. If you go outdoors, protect yourself from the sun by wearing a wide-brimmed hat, along with sunglasses.   Put on sunscreen of SPF 15 or higher, 30 minutes before going out. (The most effective products say "broad spectrum" or "UVA/UVB protection" on the label.) Reapply sunscreen frequently -- at least every 1-2 hours.   Take added precautions when both the heat and humidity are high.   Rest often.   Even young and otherwise healthy people can become heat stressed and suffer   out only during morning and evening hours, when it is cooler. Rest often in shady areas, so that your body's temperature can adjust.  If your heart pounds or you are gasping for breath, STOP all activity. Go immediately to a cool area, or at least into the shade, and rest.  This is especially true if you become lightheaded, confused, weak, or faint.  Electric fans may make you comfortable, but they DO NOT prevent heat related problems. SYMPTOMS   Headache.  Nosebleed.  Weakness.  You feel very hot.  Muscle cramps.  Restlessness.  Fainting or dizziness.  Fast breathing and shortness of breath.  Excessive sweating. (There may be little or no sweating in late stages of heat exhaustion.)  Rapid pulse, heart pounding.  Feeling sick to your stomach (nauseous, vomiting).  Skin becoming cold and clammy, or excessively hot and dry. HOME CARE INSTRUCTIONS   Lie down and rest in a cool or air conditioned area.  Drink enough water and fluids to keep your urine clear or pale yellow. Avoid fluids with caffeine or high sugar content. Avoid coffee, tea, alcohol or stimulants.  Do not take salt tablets, unless advised by your caregiver.  Avoid hot foods and heavy meals.  Bathe or shower in cool water.  Wear minimal clothing.  Use a fan. Add cool or warm mist to the air, if possible.  If possible, decrease the use of your stove or oven at home.  Monitor adults at risk at least twice a day, watching closely for signs of heat exhaustion or heat stroke. Infants and young children also require more frequent watching.  Never leave infants, children or pets in a parked car, even if the windows are cracked open.  If you are 45 years of age or older, have a friend or relative call to check on you twice a day during a heat wave. If you know someone in this age group, check on them at least twice a day. SEEK IMMEDIATE MEDICAL CARE IF:  You have a hard time breathing.  You vomit or pass blood in your stool.  You have a seizure, feel dizzy or faint, or pass out.  You develop severe sweating.  Your skin is red, hot and dry (there is no sweating).  Your urine turns a dark color or has blood in it.  You are making very little or no urine.  You are  unable to keep fluids down.  You develop chest or abdominal pain.  You develop a throbbing headache.  You develop nausea or confusion. IF YOU OBSERVE SOMEONE WHO MIGHT HAVE HEAT STROKE This can be life threatening. Call your local emergency services (911 in the [place]  If the victim is in the sun, get him or her to a shady area.  Cool the victim rapidly, using whatever methods you have:  Place the victim in a tub of cool water or a cool shower.  Spray the victim with cool water from a garden hose, or sponge the person with cool water.  Wrap the victim in a cool, wet sheet and fan them.  If emergency medical help is delayed, call the hospital emergency room or your local emergency services (911 in the [place] for further instructions.  Sometimes a victim's muscles will begin to twitch from heat stroke. If this happens, keep the victim from injuring himself. However, do not place any object in the mouth. Give fluids, unless the muscle twitching makes it difficult or unsafe to do so. If there  is vomiting, make sure the airway remains open by turning the victim on his or her side. Document Released: 06/27/2000 Document Revised: 09/22/2011 Document Reviewed: 04/16/2009 [facility] Patient Information 2014 [place], [place].  Dehydration, Adult Dehydration is when you lose more fluids from the body than you take in. Vital organs like the kidneys, brain, and heart cannot function without a proper amount of fluids and salt. Any loss of fluids from the body can cause dehydration.  CAUSES   Vomiting.  Diarrhea.  Excessive sweating.  Excessive urine output.  Fever. SYMPTOMS  Mild dehydration  Thirst.  Dry lips.  Slightly dry mouth. Moderate dehydration  Very dry mouth.  Sunken eyes.  Skin does not bounce back quickly when lightly pinched and released.  Dark urine and decreased urine production.  Decreased tear production.  Headache. Severe dehydration  Very dry  mouth.  Extreme thirst.  Rapid, weak pulse (more than 100 beats per minute at rest).  Cold hands and feet.  Not able to sweat in spite of heat and temperature.  Rapid breathing.  Blue lips.  Confusion and lethargy.  Difficulty being awakened.  Minimal urine production.  No tears. DIAGNOSIS  Your caregiver will diagnose dehydration based on your symptoms and your exam. Blood and urine tests will help confirm the diagnosis. The diagnostic evaluation should also identify the cause of dehydration. TREATMENT  Treatment of mild or moderate dehydration can often be done at home by increasing the amount of fluids that you drink. It is best to drink small amounts of fluid more often. Drinking too much at one time can make vomiting worse. Refer to the home care instructions below. Severe dehydration needs to be treated at the hospital where you will probably be given intravenous (IV) fluids that contain water and electrolytes. HOME CARE INSTRUCTIONS   Ask your caregiver about specific rehydration instructions.  Drink enough fluids to keep your urine clear or pale yellow.  Drink small amounts frequently if you have nausea and vomiting.  Eat as you normally do.  Avoid:  Foods or drinks high in sugar.  Carbonated drinks.  Juice.  Extremely hot or cold fluids.  Drinks with caffeine.  Fatty, greasy foods.  Alcohol.  Tobacco.  Overeating.  Gelatin desserts.  Wash your hands well to avoid spreading bacteria and viruses.  Only take over-the-counter or prescription medicines for pain, discomfort, or fever as directed by your caregiver.  Ask your caregiver if you should continue all prescribed and over-the-counter medicines.  Keep all follow-up appointments with your caregiver. SEEK MEDICAL CARE IF:  You have abdominal pain and it increases or stays in one area (localizes).  You have a rash, stiff neck, or severe headache.  You are irritable, sleepy, or difficult to  awaken.  You are weak, dizzy, or extremely thirsty. SEEK IMMEDIATE MEDICAL CARE IF:   You are unable to keep fluids down or you get worse despite treatment.  You have frequent episodes of vomiting or diarrhea.  You have blood or green matter (bile) in your vomit.  You have blood in your stool or your stool looks black and tarry.  You have not urinated in 6 to 8 hours, or you have only urinated a small amount of very dark urine.  You have a fever.  You faint. MAKE SURE YOU:   Understand these instructions.  Will watch your condition.  Will get help right away if you are not doing well or get worse. Document Released: 06/30/2005 Document Revised: 09/22/2011 Document Reviewed: 02/17/2011  ExitCare Patient Information 2014 [place].

## 2013-12-23 NOTE — ED Notes (Signed)
Pt refused Reglan 10mg /2 mL injection.

## 2013-12-23 NOTE — ED Notes (Signed)
Patient states that has been feeling nauseated with fatigue and headache for past 3 days.

## 2013-12-23 NOTE — ED Provider Notes (Signed)
Chief Complaint   Chief Complaint  Patient presents with  . Nausea  . Fatigue    History of Present Illness   Lauren Galvan is a 43 year old female who has had a three-day history of generalized fatigue and weakness. This came on after she spent several hours outside in the sun and hot weather on a field day with some children in school. She's also had a severe, pounding headache which radiates into her neck, shoulders, and arms, dry mouth, swelling of her hands, dizziness, and blurry vision. She feels mildly short of breath and is nauseated. She notes some frequent urination. She denies any fever, chills, sore throat, stiff neck, adenopathy, chest pain, pressure, or tightness. She's had no palpitations or syncope. She denies any abdominal pain, vomiting, diarrhea, dysuria, or GYN complaints. She has no skin rash, no history of tick bite, no extremity pain, joint pain, or localized muscle weakness. She denies any paresthesias or difficulty speaking or walking. Her last menstrual period began June 9 and she's currently on it right now. She does not think she is pregnant. She's had no new medications. She is taking amlodipine 5 mg a day for high blood pressure.  Review of Systems   Other than as noted above, the patient denies any of the following symptoms. Systemic:  No fever, chills, sweats, myalgias, headache, or anorexia. ENT:  No nasal congestion, rhinorrhea, or sore throat. Lungs:  No cough, sputum production, wheezing, shortness of breath. No loud snoring, choking or gasping at night, or unrefreshing sleep. Cardiovascular:  No chest pain, palpitations, or syncope. GI:  No nausea, vomiting, abdominal pain or diarrhea. GU:  No dysuria, frequency, or hematuria. Skin:  No rash or pruritis. Psych:  No history of depression or anxiety.  PMFSH   Past medical history, family history, social history, meds, and allergies were reviewed.    Physical Examination     Vital signs:  BP 155/97   Pulse 83  Temp(Src) 98.8 F (37.1 C) (Oral)  Resp 16  SpO2 100%  LMP 12/23/2013 Filed Vitals:   12/23/13 1645 12/23/13 1847 12/23/13 1849 Sitting  12/23/13 1850 Standing   BP: 154/104  170/99 155/97  Pulse: 78 75 77 83  Temp: 98.8 F (37.1 C)     TempSrc: Oral     Resp: 16     SpO2: 100%      General:  Alert, in no distress. Eye:  PERRL, full EOMs.  Lids and conjunctivas were normal. ENT:  TMs and canals were normal, without erythema or inflammation.  Nasal mucosa was clear and uncongested, without drainage.  Mucous membranes were moist.  Pharynx was clear, without exudate or drainage.  There were no oral ulcerations or lesions. Neck:  Supple, no adenopathy, tenderness or mass. Thyroid was normal. Lungs:  No respiratory distress.  Lungs were clear to auscultation, without wheezes, rales or rhonchi.  Breath sounds were clear and equal bilaterally. Heart:  Regular rhythm, without gallops, murmers or rubs. Abdomen:  Soft, flat, and non-tender to palpation.  No hepatosplenomagaly or mass. Skin:  Clear, warm, and dry, without rash or lesions. Psych:  Normal mood and affect.  Labs   Results for orders placed during the hospital encounter of 12/23/13  CBC WITH DIFFERENTIAL      Result Value Ref Range   WBC 9.3  4.0 - 10.5 K/uL   RBC 4.87  3.87 - 5.11 MIL/uL   Hemoglobin 14.3  12.0 - 15.0 g/dL   HCT 16.142.7  09.636.0 - 04.546.0 %  MCV 87.7  78.0 - 100.0 fL   MCH 29.4  26.0 - 34.0 pg   MCHC 33.5  30.0 - 36.0 g/dL   RDW 16.1  09.6 - 04.5 %   Platelets 347  150 - 400 K/uL   Neutrophils Relative % 59  43 - 77 %   Neutro Abs 5.4  1.7 - 7.7 K/uL   Lymphocytes Relative 26  12 - 46 %   Lymphs Abs 2.5  0.7 - 4.0 K/uL   Monocytes Relative 9  3 - 12 %   Monocytes Absolute 0.9  0.1 - 1.0 K/uL   Eosinophils Relative 5  0 - 5 %   Eosinophils Absolute 0.5  0.0 - 0.7 K/uL   Basophils Relative 1  0 - 1 %   Basophils Absolute 0.1  0.0 - 0.1 K/uL  POCT URINALYSIS DIP (DEVICE)      Result Value Ref  Range   Glucose, UA NEGATIVE  NEGATIVE mg/dL   Bilirubin Urine NEGATIVE  NEGATIVE   Ketones, ur NEGATIVE  NEGATIVE mg/dL   Specific Gravity, Urine 1.020  1.005 - 1.030   Hgb urine dipstick MODERATE (*) NEGATIVE   pH 5.5  5.0 - 8.0   Protein, ur NEGATIVE  NEGATIVE mg/dL   Urobilinogen, UA 0.2  0.0 - 1.0 mg/dL   Nitrite NEGATIVE  NEGATIVE   Leukocytes, UA NEGATIVE  NEGATIVE  POCT PREGNANCY, URINE      Result Value Ref Range   Preg Test, Ur NEGATIVE  NEGATIVE  POCT I-STAT, CHEM 8      Result Value Ref Range   Sodium 138  137 - 147 mEq/L   Potassium 4.4  3.7 - 5.3 mEq/L   Chloride 96  96 - 112 mEq/L   BUN 8  6 - 23 mg/dL   Creatinine, Ser 4.09  0.50 - 1.10 mg/dL   Glucose, Bld 811 (*) 70 - 99 mg/dL   Calcium, Ion 9.14  7.82 - 1.23 mmol/L   TCO2 28  0 - 100 mmol/L   Hemoglobin 17.0 (*) 12.0 - 15.0 g/dL   HCT 95.6 (*) 21.3 - 08.6 %    Course in Urgent Care Center   She was given Gatorade which she was able to keep down well, and given Tylenol 650 mg by mouth which did relieve her headache.  Assessment   The primary encounter diagnosis was Weakness. A diagnosis of Headache was also pertinent to this visit.  This could be heat related illness. She's not orthostatic. Her blood pressure is somewhat high. I suggested for the weekend that she get rest, fluids, increase her amlodipine to 10 mg a day, and followup with her primary care physician next week.  Plan     1.  Meds:  The following meds were prescribed:   Discharge Medication List as of 12/23/2013  7:09 PM    START taking these medications   Details  !! amLODipine (NORVASC) 10 MG tablet Take 1 tablet (10 mg total) by mouth daily., Starting 12/23/2013, Until Discontinued, Normal    diclofenac (VOLTAREN) 75 MG EC tablet Take 1 twice daily as needed for headache, Normal    metoCLOPramide (REGLAN) 10 MG tablet 1 BID as needed for headache, Normal     !! - Potential duplicate medications found. Please discuss with provider.       2.  Patient Education/Counseling:  The patient was given appropriate handouts, self care instructions, and instructed in symptomatic relief.    3.  Follow up:  The patient was told to follow up here if no better in 3 to 4 days, or sooner if becoming worse in any way, and given some red flag symptoms such as shortness of breath, chest pain, abdominal pain, persistent vomiting, or neurological symptoms which would prompt immediate return.  Follow up with primary care doctor next week.     Reuben Likesavid C Rebeka Kimble, MD 12/23/13 2046

## 2014-01-05 ENCOUNTER — Emergency Department (HOSPITAL_COMMUNITY)
Admission: EM | Admit: 2014-01-05 | Discharge: 2014-01-05 | Disposition: A | Discharge status: Home / Self Care | Payer: Medicaid Other | Source: Home / Self Care | Attending: Family Medicine | Admitting: Family Medicine

## 2014-01-05 ENCOUNTER — Encounter (HOSPITAL_COMMUNITY): Payer: Self-pay | Admitting: Emergency Medicine

## 2014-01-05 DIAGNOSIS — M6740 Ganglion, unspecified site: Secondary | ICD-10-CM

## 2014-01-05 NOTE — Discharge Instructions (Signed)

## 2014-01-05 NOTE — ED Provider Notes (Signed)
CSN: [id number]     Arrival date & time 01/05/14  1407 History   First MD Initiated Contact with Patient 01/05/14 1540     Chief Complaint  Patient presents with  . lump in hand    (Consider location/radiation/quality/duration/timing/severity/associated sxs/prior Treatment) HPI Comments: 2 months of cyst at base of right thumb that has become tender to touch or when gripping objects in right hand. No known injury or previous events.   The history is provided by the patient.    Past Medical History  Diagnosis Date  . Hypertension    History reviewed. No pertinent past surgical history. History reviewed. No pertinent family history. History  Substance Use Topics  . Smoking status: Never Smoker   . Smokeless tobacco: Not on file  . Alcohol Use: No   OB History   Grav Para Term Preterm Abortions TAB SAB Ect Mult Living                 Review of Systems  All other systems reviewed and are negative.   Allergies  Latex; Penicillins; and Benadryl  Home Medications   Prior to Admission medications   Medication Sig Start Date End Date Taking? Authorizing Provider  amLODipine (NORVASC) 10 MG tablet Take 1 tablet (10 mg total) by mouth daily. 12/23/13  Yes Reuben Likesavid C Keller, MD  albuterol (PROVENTIL HFA;VENTOLIN HFA) 108 (90 BASE) MCG/ACT inhaler Inhale 1-2 puffs into the lungs every 6 (six) hours as needed for wheezing or shortness of breath. 06/13/13   Reuben Likesavid C Keller, MD  amLODipine (NORVASC) 5 MG tablet Take 1 tablet (5 mg total) by mouth daily. 08/17/13   Linna HoffJames D Kindl, MD  diclofenac (VOLTAREN) 75 MG EC tablet Take 1 twice daily as needed for headache 12/23/13   Reuben Likesavid C Keller, MD  HYDROcodone-acetaminophen [facility](NORCO) 5-325 MG per tablet Take 1-2 tablets by mouth every 6 (six) hours as needed. 09/23/13   Doug SouSam Jacubowitz, MD  metoCLOPramide (REGLAN) 10 MG tablet 1 BID as needed for headache 12/23/13   Reuben Likesavid C Keller, MD   BP 123/93  Pulse 110  Temp(Src) 99.1 F (37.3 C) (Oral)  Resp 16   SpO2 92%  LMP 12/23/2013 Physical Exam  Nursing note and vitals reviewed. Constitutional: She is oriented to person, place, and time. She appears well-developed and well-nourished. No distress.  HENT:  Head: Normocephalic and atraumatic.  Eyes: Conjunctivae are normal. No scleral icterus.  Cardiovascular: Normal rate.   Pulmonary/Chest: Effort normal.  Musculoskeletal: Normal range of motion.  +small ganglion cyst at palmar surface at base of right thumb CMS exam of right hand normal and intact  Neurological: She is alert and oriented to person, place, and time.  Skin: Skin is warm and dry. No rash noted. No erythema.  Psychiatric: She has a normal mood and affect. Her behavior is normal.    ED Course  Procedures (including critical care time) Labs Review Labs Reviewed - No data to display  Imaging Review No results found.   MDM   1. Ganglion cyst   Referred to Dr. Mina MarbleWeingold (hand surgeon) to discuss treatment options. Ibuprofen or naprosyn as directed on packaging for pain.     Jess BartersJennifer Lee [place], [place] 01/05/14 [phone]

## 2014-01-05 NOTE — ED Notes (Signed)
Patient here with c/o lump in hand for one month. Patient has already been evaluated by Lia FoyerLee Peerson, PA. Annamaria Hellingose,Jamie Renee, RN

## 2014-01-06 NOTE — ED Provider Notes (Signed)
Medical screening examination/treatment/procedure(s) were performed by a resident physician or non-physician practitioner and as the supervising physician I was immediately available for consultation/collaboration.  Evan Corey, MD    Evan S Corey, MD 01/06/14 0738 

## 2014-01-24 ENCOUNTER — Emergency Department (HOSPITAL_COMMUNITY)
Admission: EM | Admit: 2014-01-24 | Discharge: 2014-01-24 | Disposition: A | Discharge status: Home / Self Care | Payer: Medicaid Other | Source: Home / Self Care | Attending: Family Medicine | Admitting: Family Medicine

## 2014-01-24 ENCOUNTER — Encounter (HOSPITAL_COMMUNITY): Payer: Self-pay | Admitting: Emergency Medicine

## 2014-01-24 ENCOUNTER — Other Ambulatory Visit (HOSPITAL_COMMUNITY)
Admission: RE | Admit: 2014-01-24 | Discharge: 2014-01-24 | Disposition: A | Discharge status: Home / Self Care | Payer: Medicaid Other | Source: Ambulatory Visit | Attending: Emergency Medicine | Admitting: Emergency Medicine

## 2014-01-24 DIAGNOSIS — N760 Acute vaginitis: Secondary | ICD-10-CM

## 2014-01-24 DIAGNOSIS — Z113 Encounter for screening for infections with a predominantly sexual mode of transmission: Secondary | ICD-10-CM | POA: Insufficient documentation

## 2014-01-24 LAB — POCT URINALYSIS DIP (DEVICE)
Bilirubin Urine: NEGATIVE
Glucose, UA: NEGATIVE mg/dL
HGB URINE DIPSTICK: NEGATIVE
KETONES UR: NEGATIVE mg/dL
Leukocytes, UA: NEGATIVE
NITRITE: NEGATIVE
PH: 5.5 (ref 5.0–8.0)
Protein, ur: NEGATIVE mg/dL
SPECIFIC GRAVITY, URINE: 1.025 (ref 1.005–1.030)
Urobilinogen, UA: 0.2 mg/dL (ref 0.0–1.0)

## 2014-01-24 MED ORDER — METRONIDAZOLE 500 MG PO TABS: 500.0000 mg | ORAL_TABLET | Freq: Two times a day (BID) | ORAL | Status: DC

## 2014-01-24 NOTE — ED Provider Notes (Signed)
CSN: [id number]     Arrival date & time 01/24/14  1011 History   First MD Initiated Contact with Patient 01/24/14 1128     Chief Complaint  Patient presents with  . Vaginal Discharge   (Consider location/radiation/quality/duration/timing/severity/associated sxs/prior Treatment) HPI Comments: Reports LNMP 10 days ago Tried using Monistat OTC over last several days and has not had relief.   Patient is a 43 y.o. female presenting with vaginal itching. The history is provided by the patient.  Vaginal Itching This is a new problem. Episode onset: sx began 5 days ago. The problem occurs constantly.    Past Medical History  Diagnosis Date  . Hypertension    Past Surgical History  Procedure Laterality Date  . Tubal ligation    . Ovarian cyst removal     No family history on file. History  Substance Use Topics  . Smoking status: Never Smoker   . Smokeless tobacco: Not on file  . Alcohol Use: No   OB History   Grav Para Term Preterm Abortions TAB SAB Ect Mult Living                 Review of Systems  All other systems reviewed and are negative.   Allergies  Latex; Penicillins; and Benadryl  Home Medications   Prior to Admission medications   Medication Sig Start Date End Date Taking? Authorizing Provider  Miconazole Nitrate (MONISTAT 7 VA) Place vaginally.   Yes Historical Provider, MD  albuterol (PROVENTIL HFA;VENTOLIN HFA) 108 (90 BASE) MCG/ACT inhaler Inhale 1-2 puffs into the lungs every 6 (six) hours as needed for wheezing or shortness of breath. 06/13/13   Reuben Likes, MD  amLODipine (NORVASC) 10 MG tablet Take 1 tablet (10 mg total) by mouth daily. 12/23/13   Reuben Likes, MD  amLODipine (NORVASC) 5 MG tablet Take 1 tablet (5 mg total) by mouth daily. 08/17/13   Linna Hoff, MD  diclofenac (VOLTAREN) 75 MG EC tablet Take 1 twice daily as needed for headache 12/23/13   Reuben Likes, MD  HYDROcodone-acetaminophen [facility]) 5-325 MG per tablet Take 1-2 tablets by  mouth every 6 (six) hours as needed. 09/23/13   Doug Sou, MD  metoCLOPramide (REGLAN) 10 MG tablet 1 BID as needed for headache 12/23/13   Reuben Likes, MD  metroNIDAZOLE (FLAGYL) 500 MG tablet Take 1 tablet (500 mg total) by mouth 2 (two) times daily. 01/24/14   Jess Barters Braylin Formby, PA   BP 150/100  Pulse 73  Temp(Src) 98.1 F (36.7 C) (Oral)  Resp 16  SpO2 98%  LMP 01/13/2014 Physical Exam  Nursing note and vitals reviewed. Constitutional: She is oriented to person, place, and time. She appears well-developed and well-nourished. No distress.  Patient states she has yet to take her morning dose of blood pressure medication  HENT:  Head: Normocephalic and atraumatic.  Eyes: Conjunctivae are normal.  Cardiovascular: Normal rate.   Pulmonary/Chest: Effort normal.  Genitourinary: Uterus normal. Pelvic exam was performed with patient supine. There is no rash, tenderness or lesion on the right labia. There is no rash, tenderness or lesion on the left labia. Cervix exhibits no motion tenderness, no discharge and no friability. Right adnexum displays no mass, no tenderness and no fullness. Left adnexum displays no mass, no tenderness and no fullness. No erythema, tenderness or bleeding around the vagina. No foreign body around the vagina. No signs of injury around the vagina. Vaginal discharge found.  Musculoskeletal: Normal range of motion.  Neurological: She is alert and oriented to person, place, and time.  Skin: Skin is warm and dry. No rash noted. No erythema.  Psychiatric: She has a normal mood and affect. Her behavior is normal.    ED Course  Procedures (including critical care time) Labs Review Labs Reviewed  POCT URINALYSIS DIP (DEVICE)  CERVICOVAGINAL ANCILLARY ONLY    Imaging Review No results found.   MDM   1. Vaginitis   Given lack of success with OTC vaginal yeast preparation, will provide Rx for metronidazole to cover for BV and informed patient that she would  be notified if lab results indicate the need for additional treatment.    Jess BartersJennifer Lee ManchesterPresson, GeorgiaPA 01/24/14 1229

## 2014-01-24 NOTE — ED Notes (Signed)
Patient reports : vaginal itching, burning and uncomfortable.  Onset 5 days ago.  History of the same.  Patient reports this usually follows cycle.  She was out of town and new she could not get a pill, so she tried monistat--no relief

## 2014-01-24 NOTE — Discharge Instructions (Signed)
If your labs indicate the need for additional treatment, you will be notified by phone. You have been prescribed medication (metronidazole) for bacterial vaginosis. Please take as directed and if symptoms do not improve, follow up with your doctor.  Bacterial Vaginosis Bacterial vaginosis is a vaginal infection that occurs when the normal balance of bacteria in the vagina is disrupted. It results from an overgrowth of certain bacteria. This is the most common vaginal infection in women of childbearing age. Treatment is important to prevent complications, especially in pregnant women, as it can cause a premature delivery. CAUSES  Bacterial vaginosis is caused by an increase in harmful bacteria that are normally present in smaller amounts in the vagina. Several different kinds of bacteria can cause bacterial vaginosis. However, the reason that the condition develops is not fully understood. RISK FACTORS Certain activities or behaviors can put you at an increased risk of developing bacterial vaginosis, including:  Having a new sex partner or multiple sex partners.  Douching.  Using an intrauterine device (IUD) for contraception. Women do not get bacterial vaginosis from toilet seats, bedding, swimming pools, or contact with objects around them. SIGNS AND SYMPTOMS  Some women with bacterial vaginosis have no signs or symptoms. Common symptoms include:  Grey vaginal discharge.  A fishlike odor with discharge, especially after sexual intercourse.  Itching or burning of the vagina and vulva.  Burning or pain with urination. DIAGNOSIS  Your health care provider will take a medical history and examine the vagina for signs of bacterial vaginosis. A sample of vaginal fluid may be taken. Your health care provider will look at this sample under a microscope to check for bacteria and abnormal cells. A vaginal pH test may also be done.  TREATMENT  Bacterial vaginosis may be treated with antibiotic  medicines. These may be given in the form of a pill or a vaginal cream. A second round of antibiotics may be prescribed if the condition comes back after treatment.  HOME CARE INSTRUCTIONS   Only take over-the-counter or prescription medicines as directed by your health care provider.  If antibiotic medicine was prescribed, take it as directed. Make sure you finish it even if you start to feel better.  Do not have sex until treatment is completed.  Tell all sexual partners that you have a vaginal infection. They should see their health care provider and be treated if they have problems, such as a mild rash or itching.  Practice safe sex by using condoms and only having one sex partner. SEEK MEDICAL CARE IF:   Your symptoms are not improving after 3 days of treatment.  You have increased discharge or pain.  You have a fever. MAKE SURE YOU:   Understand these instructions.  Will watch your condition.  Will get help right away if you are not doing well or get worse. FOR MORE INFORMATION  Centers for Disease Control and Prevention, Division of STD Prevention: SolutionApps.co.zawww.cdc.gov/std American Sexual Health Association (ASHA): www.ashastd.org  Document Released: 06/30/2005 Document Revised: 04/20/2013 Document Reviewed: 02/09/2013 [facility] Patient Information 2015 [employer] [place]. This information is not intended to replace advice given to you by your health care provider. Make sure you discuss any questions you have with your health care provider.

## 2014-01-25 NOTE — ED Provider Notes (Signed)
Medical screening examination/treatment/procedure(s) were performed by resident physician or non-physician practitioner and as supervising physician I was immediately available for consultation/collaboration.   Catherin Doorn DOUGLAS MD.   Emonnie Cannady D Emory Leaver, MD 01/25/14 1418 

## 2014-04-19 IMAGING — CR DG ABDOMEN 2V
3 series · 3 of 3 positions shown · non-contrast
Comparison: None.

CLINICAL DATA: Back pain

EXAM:
ABDOMEN - 2 VIEW

[view not recorded (1 of 3)]
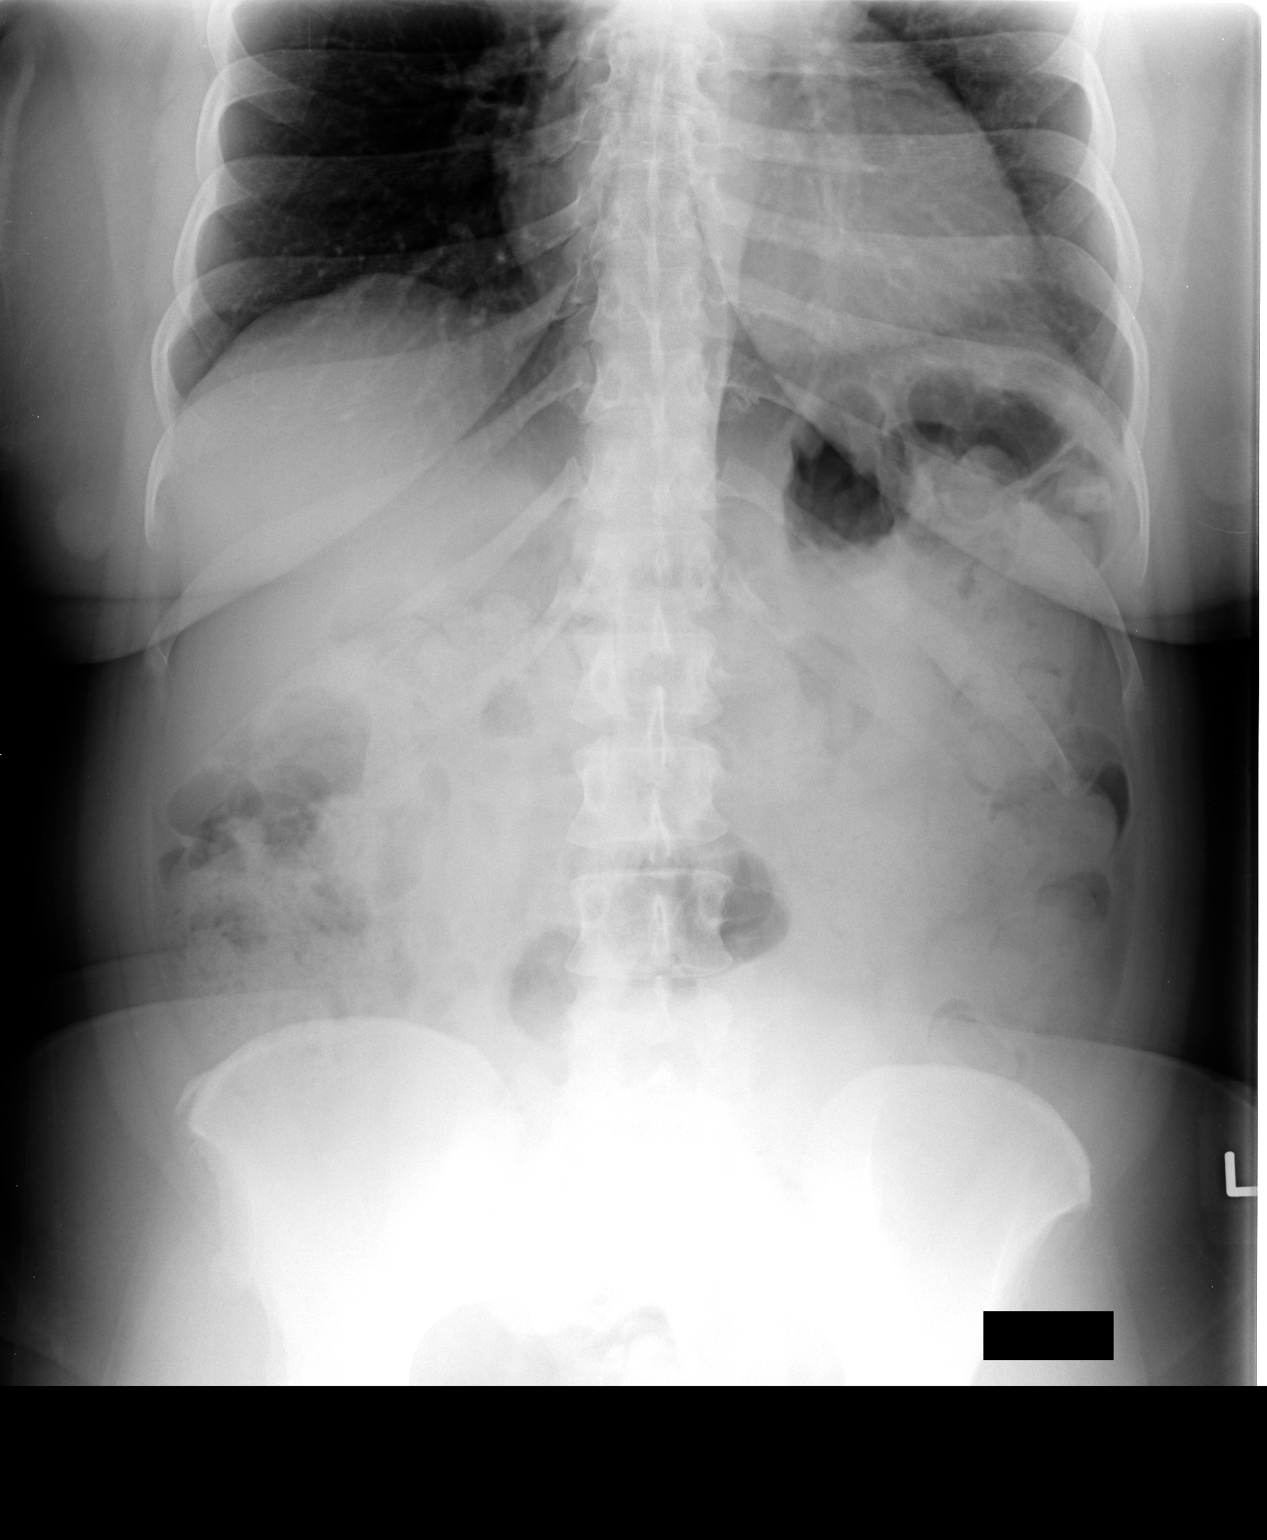

[view not recorded (2 of 3)]
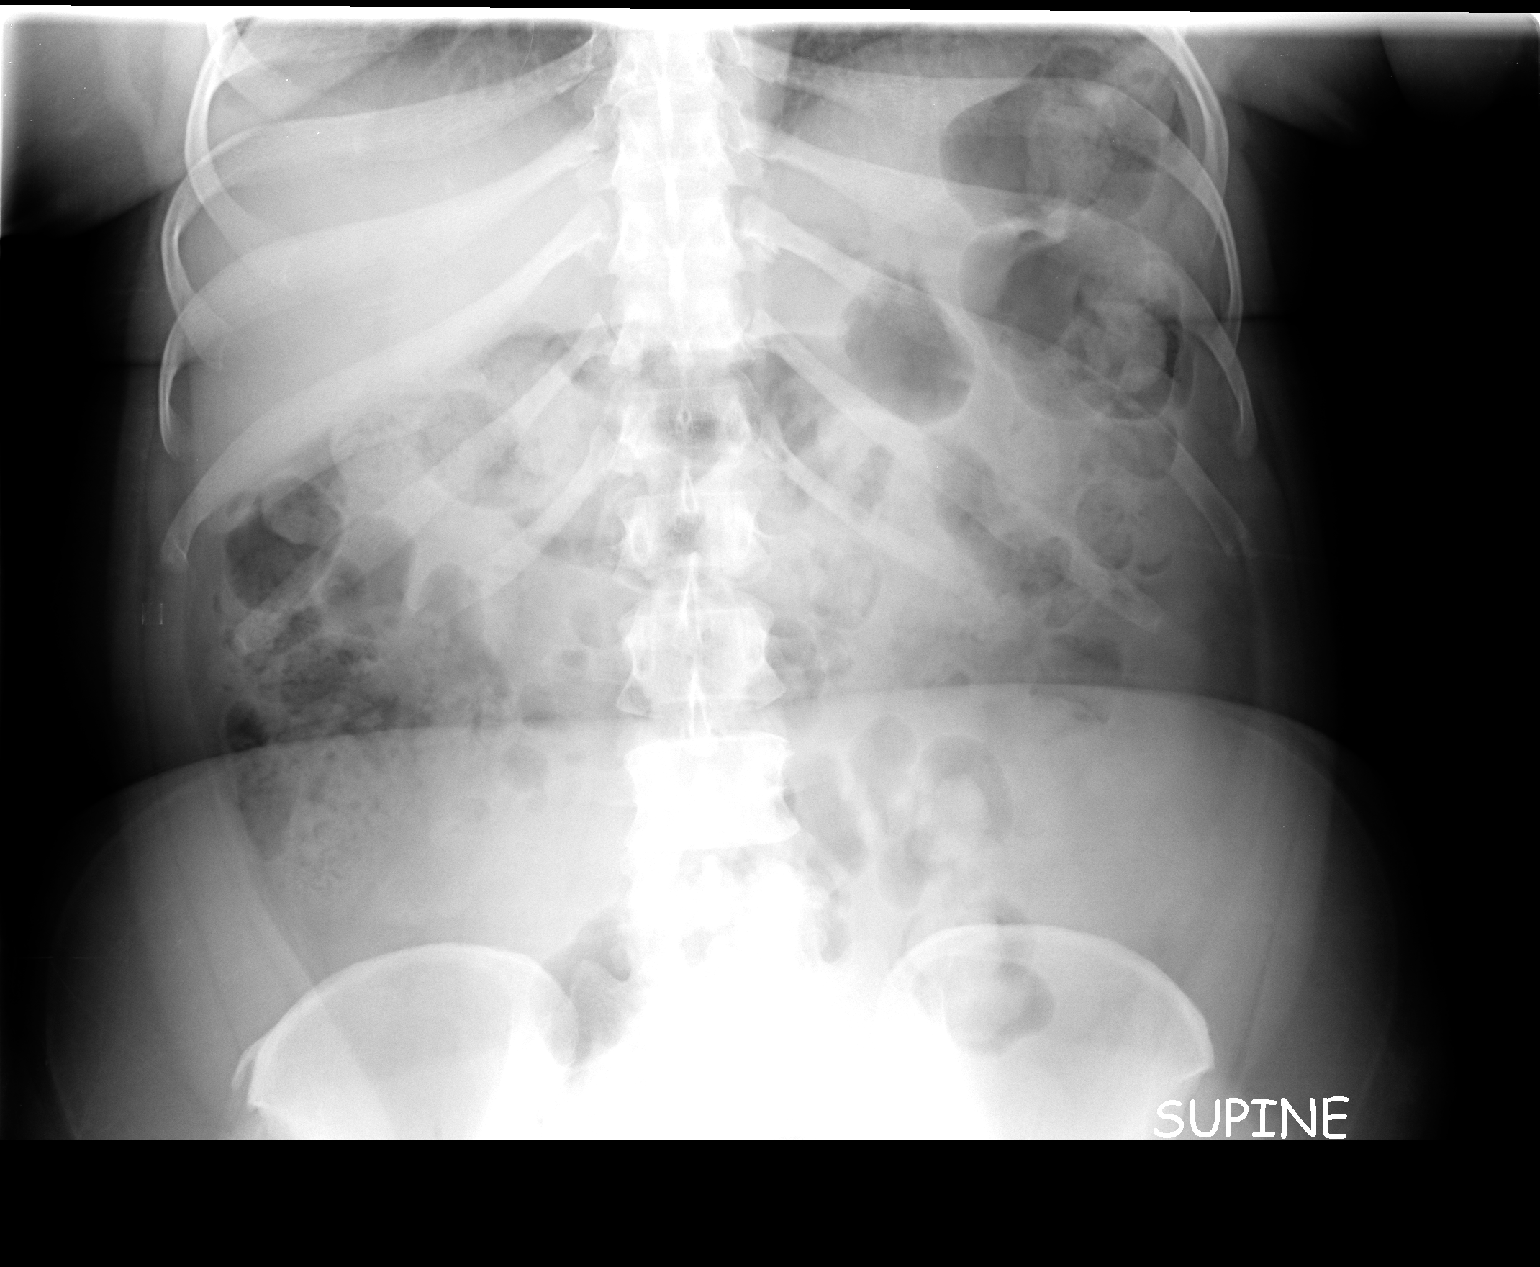

[view not recorded (3 of 3)]
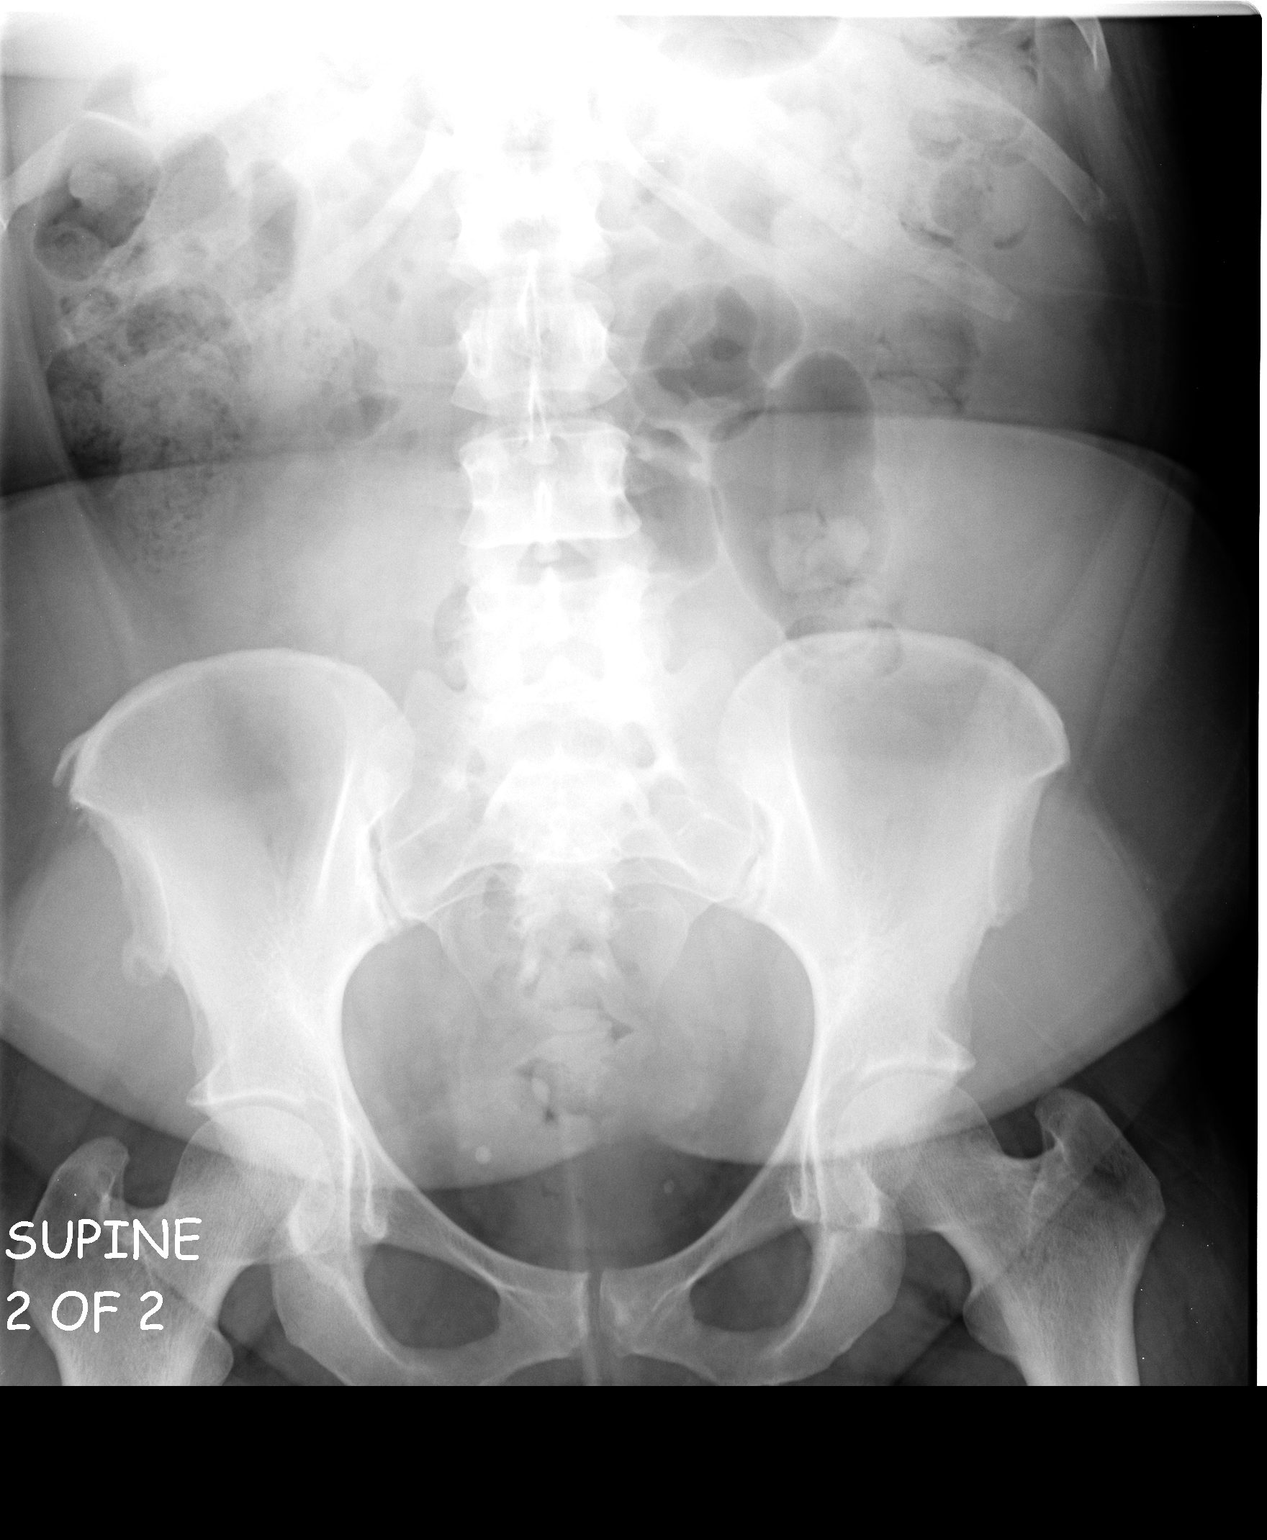

[3 of 3 positions shown; findings below may reference images not displayed]

FINDINGS: Scattered large and small bowel gas is identified. Fecal material is
noted throughout the colon. No acute bony abnormality is seen.
IMPRESSION: No acute abnormality noted.

## 2014-06-15 ENCOUNTER — Emergency Department (HOSPITAL_COMMUNITY)
Admission: EM | Admit: 2014-06-15 | Discharge: 2014-06-15 | Disposition: A | Discharge status: Home / Self Care | Payer: Medicaid Other | Source: Home / Self Care | Attending: Family Medicine | Admitting: Family Medicine

## 2014-06-15 ENCOUNTER — Encounter (HOSPITAL_COMMUNITY): Payer: Self-pay | Admitting: Family Medicine

## 2014-06-15 DIAGNOSIS — I10 Essential (primary) hypertension: Secondary | ICD-10-CM

## 2014-06-15 DIAGNOSIS — B373 Candidiasis of vulva and vagina: Secondary | ICD-10-CM

## 2014-06-15 DIAGNOSIS — B3731 Acute candidiasis of vulva and vagina: Secondary | ICD-10-CM

## 2014-06-15 MED ORDER — AMLODIPINE BESYLATE 10 MG PO TABS: 10.0000 mg | ORAL_TABLET | Freq: Every day | ORAL | Status: AC

## 2014-06-15 MED ORDER — FLUCONAZOLE 150 MG PO TABS: 150.0000 mg | ORAL_TABLET | Freq: Every day | ORAL | Status: AC

## 2014-06-15 MED ORDER — FLUCONAZOLE 150 MG PO TABS: 150.0000 mg | ORAL_TABLET | Freq: Every day | ORAL | Status: DC

## 2014-06-15 MED ORDER — HYDROCHLOROTHIAZIDE 12.5 MG PO TABS: 12.5000 mg | ORAL_TABLET | Freq: Every day | ORAL | Status: AC

## 2014-06-15 NOTE — Discharge Instructions (Signed)
Please take the diflucan for the yeast infection Please continue your amlodipine and start the HCTZ because your blood pressure is too high. Please call your new doctor tomorrow for an appointment

## 2014-06-15 NOTE — ED Provider Notes (Addendum)
CSN: [id number]     Arrival date & time 06/15/14  1831 History   First MD Initiated Contact with Patient 06/15/14 1845     Chief Complaint  Patient presents with  . Vaginal Discharge  . Vaginal Itching   (Consider location/radiation/quality/duration/timing/severity/associated sxs/prior Treatment) HPI  HTN: took medicine this morning. Denies CP, palpitations. Taking amlodpine as prescribed. Checks BP at home and it typically runs around 170/102.   Vaginal discharge. Started 3 days ago. Started at the end of cycle which is typical but this time it did not stop. Itching. White discharge w/o pain or foul odor. Denies lower abd pain, dysuria, frequency, back pain, fevers. Married adn sexually active w/ husband. Does not use condoms. Pt reports similar symptoms in the past that resolve w/ diflucan. No recent ABX.   Past Medical History  Diagnosis Date  . Hypertension    Past Surgical History  Procedure Laterality Date  . Tubal ligation    . Ovarian cyst removal     History reviewed. No pertinent family history. History  Substance Use Topics  . Smoking status: Never Smoker   . Smokeless tobacco: Not on file  . Alcohol Use: No   OB History    No data available     Review of Systems  Allergies  Latex; Penicillins; and Benadryl  Home Medications   Prior to Admission medications   Medication Sig Start Date End Date Taking? Authorizing Provider  albuterol (PROVENTIL HFA;VENTOLIN HFA) 108 (90 BASE) MCG/ACT inhaler Inhale 1-2 puffs into the lungs every 6 (six) hours as needed for wheezing or shortness of breath. 06/13/13   Reuben Likesavid C Keller, MD  amLODipine (NORVASC) 10 MG tablet Take 1 tablet (10 mg total) by mouth daily. 06/15/14   Ozella Rocksavid J Audris Speaker, MD  diclofenac (VOLTAREN) 75 MG EC tablet Take 1 twice daily as needed for headache 12/23/13   Reuben Likesavid C Keller, MD  hydrochlorothiazide (HYDRODIURIL) 12.5 MG tablet Take 1 tablet (12.5 mg total) by mouth daily. 06/15/14   Ozella Rocksavid J Elai Vanwyk, MD   Miconazole Nitrate (MONISTAT 7 VA) Place vaginally.    Historical Provider, MD   BP 176/108 mmHg  Pulse 66  Temp(Src) 98.9 F (37.2 C) (Oral)  Resp 14  SpO2 96% Physical Exam  Constitutional: She appears well-developed and well-nourished.  HENT:  Head: Normocephalic and atraumatic.  Eyes: EOM are normal. Pupils are equal, round, and reactive to light.  Neck: Normal range of motion.  Cardiovascular: Normal rate.   No murmur heard. Pulmonary/Chest: Effort normal. No respiratory distress.  Abdominal: Soft.  Musculoskeletal: Normal range of motion. She exhibits no tenderness.  Neurological: No cranial nerve deficit.  Skin: Skin is warm.  Psychiatric: She has a normal mood and affect. Her behavior is normal. Judgment and thought content normal.    ED Course  Procedures (including critical care time) Labs Review Labs Reviewed - No data to display  Imaging Review No results found.   MDM   1. Yeast vaginitis   2. Essential hypertension     HTN: refill amlodipnine, start HCTZ. Chemistry from 36mo ago nml. Pt on HCTZ previously. F/u new PCP in 1-2 wks  Vaginitis - start diflucan  Precautions given and all questions answered  Shelly Flattenavid Zaraya Delauder, MD Family Medicine 06/15/2014, 7:12 PM     Ozella Rocksavid J Diasha Castleman, MD 06/15/14 1912  Ozella Rocksavid J Wilhelmenia Addis, MD 06/15/14 [phone]

## 2014-06-15 NOTE — ED Notes (Signed)
Pt states that she has had a yeast discharge with burning and itching for 5 days.

## 2016-03-15 ENCOUNTER — Encounter (HOSPITAL_COMMUNITY): Payer: Self-pay | Admitting: Emergency Medicine

## 2016-03-15 ENCOUNTER — Ambulatory Visit (HOSPITAL_COMMUNITY)
Admission: EM | Admit: 2016-03-15 | Discharge: 2016-03-15 | Disposition: A | Discharge status: Home / Self Care | Payer: Medicaid Other | Attending: Family Medicine | Admitting: Family Medicine

## 2016-03-15 DIAGNOSIS — R21 Rash and other nonspecific skin eruption: Secondary | ICD-10-CM

## 2016-03-15 MED ORDER — METHYLPREDNISOLONE ACETATE 80 MG/ML IJ SUSP
80.0000 mg | Freq: Once | INTRAMUSCULAR | Status: AC
  Administered 2016-03-15: 80 mg via INTRAMUSCULAR

## 2016-03-15 MED ORDER — CLOTRIMAZOLE 1 % EX CREA: TOPICAL_CREAM | CUTANEOUS | 0 refills | Status: AC

## 2016-03-15 MED ORDER — METHYLPREDNISOLONE ACETATE 80 MG/ML IJ SUSP
INTRAMUSCULAR | Status: AC
  Filled 2016-03-15: qty 1

## 2016-03-15 NOTE — ED Triage Notes (Signed)
[facility] a [facility] rash on her underarms, arms and groin area x 1 week. The patient stated that this has been a recurrent issue.

## 2016-03-15 NOTE — Discharge Instructions (Signed)
Apply cream to affected areas twice daily  Call Thursday of Friday if not better   [phone]

## 2016-03-15 NOTE — ED Provider Notes (Signed)
CSN: [id number]     Arrival date & time 03/15/16  1156 History   First MD Initiated Contact with Patient 03/15/16 1254     Chief Complaint  Patient presents with  . Rash   (Consider location/radiation/quality/duration/timing/severity/associated sxs/prior Treatment) HPI 45 Y/O FEMALE WITH RED ITCHY RASH THAT HAS BEEN PRESENT FOR ABOUT 1 WEEK. HAS CHANGED DEODORANTS. RECENT SHAVING UNDERARMS.  Past Medical History:  Diagnosis Date  . Hypertension    Past Surgical History:  Procedure Laterality Date  . OVARIAN CYST REMOVAL    . TUBAL LIGATION     History reviewed. No pertinent family history. Social History  Substance Use Topics  . Smoking status: Never Smoker  . Smokeless tobacco: Never Used  . Alcohol use No   OB History    No data available     Review of Systems  Denies: HEADACHE, NAUSEA, ABDOMINAL PAIN, CHEST PAIN, CONGESTION, DYSURIA, SHORTNESS OF BREATH  Allergies  Latex; Penicillins; and Benadryl [diphenhydramine]  Home Medications   Prior to Admission medications   Medication Sig Start Date End Date Taking? Authorizing Provider  hydrochlorothiazide (HYDRODIURIL) 12.5 MG tablet Take 1 tablet (12.5 mg total) by mouth daily. 06/15/14  Yes Ozella Rocks, MD  albuterol (PROVENTIL HFA;VENTOLIN HFA) 108 (90 BASE) MCG/ACT inhaler Inhale 1-2 puffs into the lungs every 6 (six) hours as needed for wheezing or shortness of breath. 06/13/13   Reuben Likes, MD  amLODipine (NORVASC) 10 MG tablet Take 1 tablet (10 mg total) by mouth daily. 06/15/14   Ozella Rocks, MD  clotrimazole (LOTRIMIN) 1 % cream Apply to affected area 2 times daily 03/15/16   Tharon Aquas, PA  diclofenac (VOLTAREN) 75 MG EC tablet Take 1 twice daily as needed for headache 12/23/13   Reuben Likes, MD  fluconazole (DIFLUCAN) 150 MG tablet Take 1 tablet (150 mg total) by mouth daily. Repeat dose in 3 days 06/15/14   Ozella Rocks, MD  Miconazole Nitrate (MONISTAT 7 VA) Place vaginally.    Historical  Provider, MD   Meds Ordered and Administered this Visit   Medications  methylPREDNISolone acetate (DEPO-MEDROL) injection 80 mg (80 mg Intramuscular Given 03/15/16 1303)    BP 166/94 (BP Location: Right Arm)   Pulse 70   Temp 98.2 F (36.8 C) (Oral)   Resp 16   SpO2 99%  No data found.   Physical Exam NURSES NOTES AND VITAL SIGNS REVIEWED. CONSTITUTIONAL: Well developed, well nourished, no acute distress HEENT: normocephalic, atraumatic EYES: Conjunctiva normal NECK:normal ROM, supple, no adenopathy PULMONARY:No respiratory distress, normal effort ABDOMINAL: Soft, ND, NT BS+, No CVAT MUSCULOSKELETAL: Normal ROM of all extremities,  SKIN: warm and dry RED RASH BOTH AXILLA AND SUPRA PUBIC AREA. NO CELLULITIS  PSYCHIATRIC: Mood and affect, behavior are normal  Urgent Care Course   Clinical Course    Procedures (including critical care time)  Labs Review Labs Reviewed - No data to display  Imaging Review No results found.   Visual Acuity Review  Right Eye Distance:   Left Eye Distance:   Bilateral Distance:    Right Eye Near:   Left Eye Near:    Bilateral Near:         MDM   1. Rash and nonspecific skin eruption   2. Rash     Patient is reassured that there are no issues that require transfer to higher level of care at this time or additional tests. Patient is advised to continue home symptomatic treatment. Patient is  advised that if there are new or worsening symptoms to attend the emergency department, contact primary care provider, or return to UC. Instructions of care provided discharged home in stable condition.    THIS NOTE WAS GENERATED USING A VOICE RECOGNITION SOFTWARE PROGRAM. ALL REASONABLE EFFORTS  WERE MADE TO PROOFREAD THIS DOCUMENT FOR ACCURACY.  I have verbally reviewed the discharge instructions with the patient. A printed AVS was given to the patient.  All questions were answered prior to discharge.      Tharon AquasFrank C Airen Dales,  PA 03/15/16 1728
